# Patient Record
Sex: Female | Born: 1990 | Race: Black or African American | Hispanic: No | Marital: Single | Smoking: Current every day smoker
Health system: Southern US, Community
[De-identification: ages and names within clinical notes are randomized; demographics above are authoritative.]

## PROBLEM LIST (undated history)

## (undated) ENCOUNTER — Inpatient Hospital Stay (HOSPITAL_COMMUNITY): Payer: Self-pay

## (undated) DIAGNOSIS — R569 Unspecified convulsions: Secondary | ICD-10-CM

## (undated) DIAGNOSIS — B999 Unspecified infectious disease: Secondary | ICD-10-CM

## (undated) DIAGNOSIS — J45909 Unspecified asthma, uncomplicated: Secondary | ICD-10-CM

## (undated) HISTORY — PX: NO PAST SURGERIES: SHX2092

## (undated) HISTORY — DX: Unspecified infectious disease: B99.9

---
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
## 2010-09-02 LAB — WET PREP, GENITAL

## 2010-09-02 LAB — GC/CHLAMYDIA PROBE AMP, GENITAL
Chlamydia, DNA Probe: NEGATIVE
GC Probe Amp, Genital: POSITIVE — AB

## 2011-07-29 ENCOUNTER — Emergency Department (HOSPITAL_COMMUNITY)
Admission: EM | Admit: 2011-07-29 | Discharge: 2011-07-29 | Disposition: A | Discharge status: Home / Self Care | Payer: No Typology Code available for payment source | Attending: Emergency Medicine | Admitting: Emergency Medicine

## 2011-07-29 ENCOUNTER — Encounter (HOSPITAL_COMMUNITY): Payer: Self-pay | Admitting: *Deleted

## 2011-07-29 ENCOUNTER — Emergency Department (HOSPITAL_COMMUNITY): Payer: No Typology Code available for payment source

## 2011-07-29 DIAGNOSIS — R51 Headache: Secondary | ICD-10-CM | POA: Insufficient documentation

## 2011-07-29 DIAGNOSIS — M542 Cervicalgia: Secondary | ICD-10-CM | POA: Insufficient documentation

## 2011-07-29 DIAGNOSIS — S139XXA Sprain of joints and ligaments of unspecified parts of neck, initial encounter: Secondary | ICD-10-CM | POA: Insufficient documentation

## 2011-07-29 DIAGNOSIS — S161XXA Strain of muscle, fascia and tendon at neck level, initial encounter: Secondary | ICD-10-CM

## 2011-07-29 MED ORDER — METHOCARBAMOL 500 MG PO TABS: 500.0000 mg | ORAL_TABLET | Freq: Four times a day (QID) | ORAL | Status: AC | PRN
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
## 2011-07-29 NOTE — ED Provider Notes (Signed)
Medical screening examination/treatment/procedure(s) were performed by non-physician practitioner and as supervising physician I was immediately available for consultation/collaboration.  Ethelda Chick, MD 07/29/11 [phone]
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
## 2011-07-29 NOTE — ED Notes (Signed)
Pt ambulated with standby assist to bathroom. C-collar remains in place. Cleared by Judeth Cornfield, EDPA to walk.

## 2012-02-09 ENCOUNTER — Ambulatory Visit (INDEPENDENT_AMBULATORY_CARE_PROVIDER_SITE_OTHER): Payer: Medicaid Other | Admitting: Obstetrics and Gynecology

## 2012-02-09 DIAGNOSIS — Z331 Pregnant state, incidental: Secondary | ICD-10-CM

## 2012-02-09 LAB — POCT URINALYSIS DIPSTICK
Glucose, UA: NEGATIVE
Ketones, UA: NEGATIVE
Nitrite, UA: NEGATIVE

## 2012-02-09 NOTE — Progress Notes (Signed)
NOB INTERVIEW. PT IS UNSURE OF EXACT LMP. HAS NOB W/U 02/11/12 BUT NO U/S APPT FOR ANATOMY AVAILABLE. IS UNDECIDED ABOUT GENETIC TESTING.

## 2012-02-10 LAB — PRENATAL PANEL VII
Antibody Screen: NEGATIVE
HCT: 36.5 % (ref 36.0–46.0)
HIV: NONREACTIVE
Hemoglobin: 12.3 g/dL (ref 12.0–15.0)
Hepatitis B Surface Ag: NEGATIVE
Lymphocytes Relative: 28 % (ref 12–46)
MCV: 89.2 fL (ref 78.0–100.0)
Neutrophils Relative %: 58 % (ref 43–77)
RDW: 15.5 % (ref 11.5–15.5)
Rh Type: POSITIVE
WBC: 9.9 10*3/uL (ref 4.0–10.5)

## 2012-02-11 ENCOUNTER — Encounter: Payer: Self-pay | Admitting: Obstetrics and Gynecology

## 2012-02-11 ENCOUNTER — Ambulatory Visit (INDEPENDENT_AMBULATORY_CARE_PROVIDER_SITE_OTHER): Payer: Medicaid Other | Admitting: Obstetrics and Gynecology

## 2012-02-11 VITALS — BP 108/62 | Wt 192.0 lb

## 2012-02-11 DIAGNOSIS — Z202 Contact with and (suspected) exposure to infections with a predominantly sexual mode of transmission: Secondary | ICD-10-CM | POA: Insufficient documentation

## 2012-02-11 DIAGNOSIS — Z331 Pregnant state, incidental: Secondary | ICD-10-CM

## 2012-02-11 DIAGNOSIS — Z2089 Contact with and (suspected) exposure to other communicable diseases: Secondary | ICD-10-CM

## 2012-02-11 DIAGNOSIS — Z1379 Encounter for other screening for genetic and chromosomal anomalies: Secondary | ICD-10-CM

## 2012-02-11 DIAGNOSIS — Z3689 Encounter for other specified antenatal screening: Secondary | ICD-10-CM

## 2012-02-11 DIAGNOSIS — O0930 Supervision of pregnancy with insufficient antenatal care, unspecified trimester: Secondary | ICD-10-CM | POA: Insufficient documentation

## 2012-02-11 LAB — HEMOGLOBINOPATHY EVALUATION
Hemoglobin Other: 0 %
Hgb S Quant: 0 %

## 2012-02-11 NOTE — Progress Notes (Signed)
Subjective:    Jeanne [name] is being seen today for her first obstetrical visit at [redacted]w[redacted]d gestation by uncertain LMP.  She stopped OCPs approx 2 months before conception.  She reports no issues.  Her obstetrical history is significant for: Patient Active Problem List  Diagnosis  . Late prenatal care    Relationship with FOB:  Declined to name  Patient is not sure of feeding plan.   Pregnancy history fully reviewed.  The following portions of the patient's history were reviewed and updated as appropriate: allergies, current medications, past family history, past medical history, past social history, past surgical history and problem list.  Review of Systems Pertinent ROS is described in HPI   Objective:   BP 108/62  Wt 192 lb (87.091 kg)  LMP 09/14/2011 Wt Readings from Last 1 Encounters:  02/11/12 192 lb (87.091 kg)   BMI: There is no height on file to calculate BMI.  General: alert, cooperative and no distress Respiratory: clear to auscultation bilaterally Cardiovascular: regular rate and rhythm, S1, S2 normal, no murmur Breasts:  No dominant masses, nipples erect Gastrointestinal: soft, non-tender; no masses,  no organomegaly Extremities: extremities normal, no pain or edema Vaginal Bleeding: None  EXTERNAL GENITALIA: normal appearing vulva with no masses, tenderness or lesions VAGINA: no abnormal discharge or lesions CERVIX: no lesions or cervical motion tenderness; cervix closed, long, firm UTERUS: gravid and consistent with 20-21 weeks ADNEXA: no masses palpable and nontender OB EXAM PELVIMETRY: appears adequate   FHR:  150  bpm  Assessment:    Pregnancy at  21 3/7 weeks by unsure LMP, S=D Late to care Plan:     Prenatal panel reviewed and discussed with the patient:yes Pap smear collected:no GC/Chlamydia collected:yes Wet prep:  Negative Discussion of Genetic testing options: Wants Quad screen--done today Prenatal vitamins recommended Problem list  reviewed and updated.  Plan of care: Schedule [place] for anatomy ASAP--patient prefers exam at [facility].  Nigel Bridgeman CNM, [place] 02/11/2012 3:52 PM                                                             sickle cell negative ANAtomy US at [facility] screen today

## 2012-02-16 LAB — AFP, QUAD SCREEN
Down Syndrome Scr Risk Est: 1:12100 {titer}
HCG, Total: 19096 m[IU]/mL
INH: 199.8 pg/mL
MoM for AFP: 1.47
MoM for INH: 1.08
Open Spina bifida: NEGATIVE
uE3 Value: 1.5 ng/mL

## 2012-02-17 ENCOUNTER — Other Ambulatory Visit: Payer: Medicaid Other

## 2012-02-18 ENCOUNTER — Ambulatory Visit (INDEPENDENT_AMBULATORY_CARE_PROVIDER_SITE_OTHER): Payer: Medicaid Other | Admitting: Obstetrics and Gynecology

## 2012-02-18 ENCOUNTER — Encounter: Payer: Self-pay | Admitting: Obstetrics and Gynecology

## 2012-02-18 ENCOUNTER — Ambulatory Visit (INDEPENDENT_AMBULATORY_CARE_PROVIDER_SITE_OTHER): Payer: Medicaid Other

## 2012-02-18 VITALS — BP 98/66 | Ht 67.0 in | Wt 194.0 lb

## 2012-02-18 DIAGNOSIS — J00 Acute nasopharyngitis [common cold]: Secondary | ICD-10-CM

## 2012-02-18 DIAGNOSIS — Z331 Pregnant state, incidental: Secondary | ICD-10-CM

## 2012-02-18 DIAGNOSIS — Z3689 Encounter for other specified antenatal screening: Secondary | ICD-10-CM

## 2012-02-18 DIAGNOSIS — Z3490 Encounter for supervision of normal pregnancy, unspecified, unspecified trimester: Secondary | ICD-10-CM

## 2012-02-18 LAB — US OB COMP + 14 WK

## 2012-02-18 NOTE — Progress Notes (Signed)
Anatomy u/s today  C/o cold sx's

## 2012-02-18 NOTE — Progress Notes (Signed)
[redacted]w[redacted]d Vx presentation, anterior placenta, (placenta edge is 5.7cms from internal os. Fluid is normal ( Vertical Pocket = 4.2 cm) No anomalies seen Female gender. Normal ovaries No fluid in CDS, normal adnexas ROB x 4 weeks No have f/u USS in 4 weeks for placental location.

## 2012-03-17 ENCOUNTER — Ambulatory Visit (INDEPENDENT_AMBULATORY_CARE_PROVIDER_SITE_OTHER): Payer: Medicaid Other | Admitting: Obstetrics and Gynecology

## 2012-03-17 VITALS — BP 102/62 | Wt 202.0 lb

## 2012-03-17 DIAGNOSIS — Z3490 Encounter for supervision of normal pregnancy, unspecified, unspecified trimester: Secondary | ICD-10-CM

## 2012-03-17 DIAGNOSIS — Z331 Pregnant state, incidental: Secondary | ICD-10-CM
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
## 2012-04-04 ENCOUNTER — Telehealth: Payer: Self-pay

## 2012-04-04 MED ORDER — PRENATAL 27-0.8 MG PO TABS: 1.0000 | ORAL_TABLET | Freq: Every day | ORAL | Status: DC

## 2012-04-04 NOTE — Telephone Encounter (Signed)
Pt calling for refill of PNV.  rx sent to pharmacy for 30 with 11 refills.  Pt notified.  ld

## 2012-04-04 NOTE — Telephone Encounter (Signed)
rx called in due to failure in escribe.  ld

## 2012-04-07 ENCOUNTER — Encounter: Payer: Medicaid Other | Admitting: Obstetrics and Gynecology

## 2012-04-13 ENCOUNTER — Ambulatory Visit (INDEPENDENT_AMBULATORY_CARE_PROVIDER_SITE_OTHER): Payer: Medicaid Other | Admitting: Obstetrics and Gynecology

## 2012-04-13 VITALS — BP 110/60 | Wt 211.0 lb

## 2012-04-13 DIAGNOSIS — Z3400 Encounter for supervision of normal first pregnancy, unspecified trimester: Secondary | ICD-10-CM

## 2012-04-13 LAB — CBC
HCT: 34.5 % — ABNORMAL LOW (ref 36.0–46.0)
MCH: 31.4 pg (ref 26.0–34.0)
MCHC: 35.1 g/dL (ref 30.0–36.0)
RDW: 13.8 % (ref 11.5–15.5)

## 2012-04-13 LAB — RPR

## 2012-04-13 NOTE — Progress Notes (Signed)
[redacted]w[redacted]d Office Visit on 03/17/2012  Component Date Value Range Status  . Glucose, 1 Hour GTT 03/17/2012 102  70 - 140 mg/dL Final  CBC and RPR today Wants to change PNV to prenatal gen FKCs RTO 2wks

## 2012-04-27 ENCOUNTER — Ambulatory Visit (INDEPENDENT_AMBULATORY_CARE_PROVIDER_SITE_OTHER): Payer: Medicaid Other | Admitting: Obstetrics and Gynecology

## 2012-04-27 VITALS — BP 104/58 | Wt 209.0 lb

## 2012-04-27 DIAGNOSIS — Z331 Pregnant state, incidental: Secondary | ICD-10-CM

## 2012-04-27 MED ORDER — PRENATAL VITAMINS PLUS 27-1 MG PO TABS: 1.0000 | ORAL_TABLET | Freq: Every day | ORAL | Status: DC

## 2012-04-27 NOTE — Progress Notes (Signed)
[redacted]w[redacted]d GFM Right round ligament

## 2012-04-27 NOTE — Progress Notes (Signed)
Pt stated her skin has been very dry and itching . Pt declined the flu shot today. Pt stated no other issues today.

## 2012-05-11 ENCOUNTER — Encounter: Payer: Self-pay | Admitting: Obstetrics and Gynecology

## 2012-05-11 ENCOUNTER — Ambulatory Visit (INDEPENDENT_AMBULATORY_CARE_PROVIDER_SITE_OTHER): Payer: Medicaid Other

## 2012-05-11 ENCOUNTER — Ambulatory Visit (INDEPENDENT_AMBULATORY_CARE_PROVIDER_SITE_OTHER): Payer: Medicaid Other | Admitting: Obstetrics and Gynecology

## 2012-05-11 VITALS — BP 120/68 | Wt 212.5 lb

## 2012-05-11 DIAGNOSIS — Z331 Pregnant state, incidental: Secondary | ICD-10-CM

## 2012-05-11 DIAGNOSIS — O0930 Supervision of pregnancy with insufficient antenatal care, unspecified trimester: Secondary | ICD-10-CM

## 2012-05-11 DIAGNOSIS — O9989 Other specified diseases and conditions complicating pregnancy, childbirth and the puerperium: Secondary | ICD-10-CM

## 2012-05-11 MED ORDER — VITAFOL ULTRA 29-0.6-0.4-200 MG PO CAPS: 1.0000 | ORAL_CAPSULE | Freq: Every day | ORAL | Status: DC

## 2012-05-11 NOTE — Progress Notes (Signed)
[redacted]w[redacted]d Pt voice no complaints  Ultrasound shows:  EFW 5lbs5oz 61%ILE        [place] EDD: 06/20/2012             AFI: 15.9cm            Cervical length: 3.47 cm            Placenta localization: anterior            Fetal presentation: cephalic    Anatomy survey completed yes    Anatomy survey is normal            Gender : female    Comments: No previa. Placenta edge to cx normal. Normal fluid. AFI of 15.9cm 55th%tile. Normal linear growth. Concordant with established GA/EDD. Cx closed. Normal adnexa. Pt declines Flu vaccine Requests different prenatal.

## 2012-05-11 NOTE — Addendum Note (Signed)
Addended by: Lerry Liner D on: 05/11/2012 09:48 AM   Modules accepted: Orders

## 2012-05-16 LAB — US OB FOLLOW UP

## 2012-05-25 ENCOUNTER — Ambulatory Visit: Payer: Medicaid Other | Admitting: Obstetrics and Gynecology

## 2012-05-25 ENCOUNTER — Other Ambulatory Visit: Payer: Medicaid Other

## 2012-05-25 ENCOUNTER — Encounter: Payer: Self-pay | Admitting: Obstetrics and Gynecology

## 2012-05-26 NOTE — Progress Notes (Signed)
Appointment canceled.

## 2012-06-01 ENCOUNTER — Encounter: Payer: Self-pay | Admitting: Obstetrics and Gynecology

## 2012-06-01 ENCOUNTER — Ambulatory Visit (INDEPENDENT_AMBULATORY_CARE_PROVIDER_SITE_OTHER): Payer: Medicaid Other | Admitting: Obstetrics and Gynecology

## 2012-06-01 ENCOUNTER — Other Ambulatory Visit: Payer: Medicaid Other

## 2012-06-01 VITALS — BP 120/70 | Wt 221.0 lb

## 2012-06-01 DIAGNOSIS — Z331 Pregnant state, incidental: Secondary | ICD-10-CM

## 2012-06-01 DIAGNOSIS — B009 Herpesviral infection, unspecified: Secondary | ICD-10-CM | POA: Insufficient documentation

## 2012-06-01 MED ORDER — GNP PRENATAL VITAMINS 28-0.8 MG PO TABS: 0.8000 mg | ORAL_TABLET | Freq: Every day | ORAL | Status: DC

## 2012-06-01 MED ORDER — VALACYCLOVIR HCL 500 MG PO TABS: 500.0000 mg | ORAL_TABLET | Freq: Every day | ORAL | Status: DC

## 2012-06-01 NOTE — Progress Notes (Signed)
[redacted]w[redacted]d The patient says she has a history of herpes.  She denies outbreaks.  We'll begin Valtrex 500 mg daily. Beta strep, GC, Chlamydia sent. No lesions on exam. possible carpal tunnel syndrome area Return office in 1 week. Dr. Stefano Gaul

## 2012-06-01 NOTE — Addendum Note (Signed)
Addended by: Janine Limbo on: 06/01/2012 07:22 PM   Modules accepted: Orders

## 2012-06-01 NOTE — Addendum Note (Signed)
Addended byWinfred Leeds on: 06/01/2012 12:08 PM   Modules accepted: Orders

## 2012-06-01 NOTE — Progress Notes (Signed)
[redacted]w[redacted]d PT STATES THINGS ARE HARD TO HOLD WITH HER HANDS. PT STATES THE PNV MAKE HER HANDS RED. PT WANT ANOTHER PRENATAL VIT.

## 2012-06-02 LAB — GC/CHLAMYDIA PROBE AMP: GC Probe RNA: NEGATIVE

## 2012-06-07 ENCOUNTER — Encounter: Payer: Self-pay | Admitting: Obstetrics and Gynecology

## 2012-06-07 ENCOUNTER — Telehealth: Payer: Self-pay | Admitting: Obstetrics and Gynecology

## 2012-06-07 ENCOUNTER — Ambulatory Visit (INDEPENDENT_AMBULATORY_CARE_PROVIDER_SITE_OTHER): Payer: Medicaid Other | Admitting: Obstetrics and Gynecology

## 2012-06-07 VITALS — BP 108/64 | Wt 222.0 lb

## 2012-06-07 DIAGNOSIS — Z331 Pregnant state, incidental: Secondary | ICD-10-CM

## 2012-06-07 NOTE — Telephone Encounter (Signed)
TC from pt. Questioning what effacement means. Explained effacement and dilation of cervix. Pt verbalizes comprehension.

## 2012-06-07 NOTE — Progress Notes (Signed)
[redacted]w[redacted]d Pt has no complaints Pt wants cervix checked

## 2012-06-07 NOTE — Progress Notes (Signed)
[redacted]w[redacted]d GFM No contractions  No change in discharge Valtrex while breastfeeding discussed.

## 2012-06-14 ENCOUNTER — Encounter (HOSPITAL_COMMUNITY): Payer: Self-pay | Admitting: *Deleted

## 2012-06-14 ENCOUNTER — Encounter (HOSPITAL_COMMUNITY): Payer: Self-pay | Admitting: Anesthesiology

## 2012-06-14 ENCOUNTER — Inpatient Hospital Stay (HOSPITAL_COMMUNITY)
Admission: AD | Admit: 2012-06-14 | Discharge: 2012-06-14 | Disposition: A | Discharge status: Home / Self Care | Payer: Medicaid Other | Source: Ambulatory Visit | Attending: Obstetrics and Gynecology | Admitting: Obstetrics and Gynecology

## 2012-06-14 ENCOUNTER — Inpatient Hospital Stay (HOSPITAL_COMMUNITY): Payer: Medicaid Other | Admitting: Anesthesiology

## 2012-06-14 ENCOUNTER — Inpatient Hospital Stay (HOSPITAL_COMMUNITY)
Admission: AD | Admit: 2012-06-14 | Discharge: 2012-06-17 | DRG: 775 | Disposition: A | Discharge status: Home / Self Care | Payer: Medicaid Other | Source: Ambulatory Visit | Attending: Obstetrics and Gynecology | Admitting: Obstetrics and Gynecology

## 2012-06-14 DIAGNOSIS — O0930 Supervision of pregnancy with insufficient antenatal care, unspecified trimester: Secondary | ICD-10-CM

## 2012-06-14 DIAGNOSIS — O99891 Other specified diseases and conditions complicating pregnancy: Secondary | ICD-10-CM | POA: Insufficient documentation

## 2012-06-14 DIAGNOSIS — J00 Acute nasopharyngitis [common cold]: Secondary | ICD-10-CM

## 2012-06-14 DIAGNOSIS — B009 Herpesviral infection, unspecified: Secondary | ICD-10-CM

## 2012-06-14 DIAGNOSIS — R109 Unspecified abdominal pain: Secondary | ICD-10-CM | POA: Insufficient documentation

## 2012-06-14 DIAGNOSIS — Z22330 Carrier of Group B streptococcus: Secondary | ICD-10-CM

## 2012-06-14 DIAGNOSIS — O99892 Other specified diseases and conditions complicating childbirth: Secondary | ICD-10-CM | POA: Diagnosis present

## 2012-06-14 DIAGNOSIS — Z202 Contact with and (suspected) exposure to infections with a predominantly sexual mode of transmission: Secondary | ICD-10-CM

## 2012-06-14 DIAGNOSIS — O479 False labor, unspecified: Secondary | ICD-10-CM

## 2012-06-14 DIAGNOSIS — O4290 Premature rupture of membranes, unspecified as to length of time between rupture and onset of labor, unspecified weeks of gestation: Secondary | ICD-10-CM

## 2012-06-14 LAB — CBC
HCT: 40.6 % (ref 36.0–46.0)
MCV: 92.3 fL (ref 78.0–100.0)
Platelets: 176 10*3/uL (ref 150–400)
RBC: 4.4 MIL/uL (ref 3.87–5.11)
WBC: 17.5 10*3/uL — ABNORMAL HIGH (ref 4.0–10.5)

## 2012-06-14 MED ORDER — PENICILLIN G POTASSIUM 5000000 UNITS IJ SOLR
2.5000 10*6.[IU] | INTRAVENOUS | Status: DC
  Administered 2012-06-14 – 2012-06-15 (×2): 2.5 10*6.[IU] via INTRAVENOUS
  Filled 2012-06-14 (×6): qty 2.5

## 2012-06-14 MED ORDER — OXYTOCIN 40 UNITS IN LACTATED RINGERS INFUSION - SIMPLE MED: 62.5000 mL/h | INTRAVENOUS | Status: DC

## 2012-06-14 MED ORDER — HYDROXYZINE HCL 50 MG/ML IM SOLN: 50.0000 mg | Freq: Four times a day (QID) | INTRAMUSCULAR | Status: DC | PRN

## 2012-06-14 MED ORDER — ONDANSETRON HCL 4 MG/2ML IJ SOLN: 4.0000 mg | Freq: Four times a day (QID) | INTRAMUSCULAR | Status: DC | PRN

## 2012-06-14 MED ORDER — OXYCODONE-ACETAMINOPHEN 5-325 MG PO TABS
1.0000 | ORAL_TABLET | ORAL | Status: DC | PRN
Start: 2012-06-14 — End: 2012-06-15

## 2012-06-14 MED ORDER — FENTANYL 2.5 MCG/ML BUPIVACAINE 1/10 % EPIDURAL INFUSION (WH - ANES)
INTRAMUSCULAR | Status: DC | PRN
  Administered 2012-06-14: 14 mL/h via EPIDURAL

## 2012-06-14 MED ORDER — LIDOCAINE HCL (PF) 1 % IJ SOLN
INTRAMUSCULAR | Status: DC | PRN
  Administered 2012-06-14 (×2): 4 mL

## 2012-06-14 MED ORDER — BUTORPHANOL TARTRATE 1 MG/ML IJ SOLN: 1.0000 mg | INTRAMUSCULAR | Status: DC | PRN

## 2012-06-14 MED ORDER — PENICILLIN G POTASSIUM 5000000 UNITS IJ SOLR
5.0000 10*6.[IU] | Freq: Once | INTRAVENOUS | Status: AC
  Administered 2012-06-14: 5 10*6.[IU] via INTRAVENOUS
  Filled 2012-06-14: qty 5

## 2012-06-14 MED ORDER — DIPHENHYDRAMINE HCL 50 MG/ML IJ SOLN: 12.5000 mg | INTRAMUSCULAR | Status: DC | PRN

## 2012-06-14 MED ORDER — TERBUTALINE SULFATE 1 MG/ML IJ SOLN: 0.2500 mg | Freq: Once | INTRAMUSCULAR | Status: AC | PRN

## 2012-06-14 MED ORDER — PHENYLEPHRINE 40 MCG/ML (10ML) SYRINGE FOR IV PUSH (FOR BLOOD PRESSURE SUPPORT): 80.0000 ug | PREFILLED_SYRINGE | INTRAVENOUS | Status: DC | PRN

## 2012-06-14 MED ORDER — FENTANYL 2.5 MCG/ML BUPIVACAINE 1/10 % EPIDURAL INFUSION (WH - ANES)
14.0000 mL/h | INTRAMUSCULAR | Status: DC
  Administered 2012-06-15: 14 mL/h via EPIDURAL
  Filled 2012-06-14 (×2): qty 125

## 2012-06-14 MED ORDER — EPHEDRINE 5 MG/ML INJ: 10.0000 mg | INTRAVENOUS | Status: DC | PRN

## 2012-06-14 MED ORDER — PHENYLEPHRINE 40 MCG/ML (10ML) SYRINGE FOR IV PUSH (FOR BLOOD PRESSURE SUPPORT)
80.0000 ug | PREFILLED_SYRINGE | INTRAVENOUS | Status: DC | PRN
  Filled 2012-06-14: qty 5

## 2012-06-14 MED ORDER — OXYTOCIN 40 UNITS IN LACTATED RINGERS INFUSION - SIMPLE MED
1.0000 m[IU]/min | INTRAVENOUS | Status: DC
  Administered 2012-06-14: 2 m[IU]/min via INTRAVENOUS
  Filled 2012-06-14: qty 1000

## 2012-06-14 MED ORDER — LIDOCAINE HCL (PF) 1 % IJ SOLN
30.0000 mL | INTRAMUSCULAR | Status: DC | PRN
  Filled 2012-06-14: qty 30

## 2012-06-14 MED ORDER — ACETAMINOPHEN 325 MG PO TABS: 650.0000 mg | ORAL_TABLET | ORAL | Status: DC | PRN

## 2012-06-14 MED ORDER — MORPHINE SULFATE 10 MG/ML IJ SOLN: 10.0000 mg | Freq: Once | INTRAMUSCULAR | Status: DC | PRN

## 2012-06-14 MED ORDER — LACTATED RINGERS IV SOLN: 500.0000 mL | INTRAVENOUS | Status: DC | PRN

## 2012-06-14 MED ORDER — ZOLPIDEM TARTRATE 5 MG PO TABS
5.0000 mg | ORAL_TABLET | Freq: Once | ORAL | Status: AC | PRN
  Administered 2012-06-14: 5 mg via ORAL
  Filled 2012-06-14: qty 1

## 2012-06-14 MED ORDER — EPHEDRINE 5 MG/ML INJ
10.0000 mg | INTRAVENOUS | Status: DC | PRN
  Filled 2012-06-14: qty 4

## 2012-06-14 MED ORDER — CITRIC ACID-SODIUM CITRATE 334-500 MG/5ML PO SOLN: 30.0000 mL | ORAL | Status: DC | PRN

## 2012-06-14 MED ORDER — IBUPROFEN 600 MG PO TABS: 600.0000 mg | ORAL_TABLET | Freq: Four times a day (QID) | ORAL | Status: DC | PRN

## 2012-06-14 MED ORDER — LACTATED RINGERS IV SOLN
INTRAVENOUS | Status: DC
  Administered 2012-06-14 (×2): via INTRAVENOUS

## 2012-06-14 MED ORDER — LACTATED RINGERS IV SOLN
500.0000 mL | Freq: Once | INTRAVENOUS | Status: AC
  Administered 2012-06-14: 1000 mL via INTRAVENOUS

## 2012-06-14 MED ORDER — OXYTOCIN BOLUS FROM INFUSION: 500.0000 mL | INTRAVENOUS | Status: DC

## 2012-06-14 NOTE — Progress Notes (Signed)
Comfortable with epidural states sleepy agrees to IV pitocin O BP 118/75  Pulse 88  Temp 98.3 F (36.8 C) (Oral)  Resp 18  Ht 5\' 6"  (1.676 m)  Wt 221 lb 4.8 oz (100.381 kg)  BMI 35.72 kg/m2  SpO2 100%  LMP 09/14/2011      fhts category 1      abd soft between uc      Contractions q 2-6 mild      Vag not assessed VTX per [place] Pitocin protocol A [redacted]w[redacted]d srom P pitocin augmentation discussed limited vag exams pt agrees, encouraged to rest, family at bedside, continue care Lavera Guise, CNM

## 2012-06-14 NOTE — MAU Note (Signed)
Patient state she is having contractions every 2-3 minutes with bloody show. Reports good fetal movement.
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
## 2012-06-14 NOTE — MAU Note (Signed)
H. Steelman CNM notified of pt. 

## 2012-06-14 NOTE — Anesthesia Procedure Notes (Signed)
Epidural Patient location during procedure: OB Start time: 06/14/2012 5:23 PM  Staffing Anesthesiologist: Gualberto Wahlen A. Performed by: anesthesiologist   Preanesthetic Checklist Completed: patient identified, site marked, surgical consent, pre-op evaluation, timeout performed, IV checked, risks and benefits discussed and monitors and equipment checked  Epidural Patient position: sitting Prep: site prepped and draped and DuraPrep Patient monitoring: continuous pulse ox and blood pressure Approach: midline Injection technique: LOR air  Needle:  Needle type: Tuohy  Needle gauge: 17 G Needle length: 9 cm and 9 Needle insertion depth: 5 cm cm Catheter type: closed end flexible Catheter size: 19 Gauge Catheter at skin depth: 10 cm Test dose: negative and Other  Assessment Events: blood not aspirated, injection not painful, no injection resistance, negative IV test and no paresthesia  Additional Notes Patient identified. Risks and benefits discussed including failed block, incomplete  Pain control, post dural puncture headache, nerve damage, paralysis, blood pressure Changes, nausea, vomiting, reactions to medications-both toxic and allergic and post Partum back pain. All questions were answered. Patient expressed understanding and wished to proceed. Sterile technique was used throughout procedure. Epidural site was Dressed with sterile barrier dressing. No paresthesias, signs of intravascular injection Or signs of intrathecal spread were encountered.  Patient was more comfortable after the epidural was dosed. Please see RN's note for documentation of vital signs and FHR which are stable.

## 2012-06-14 NOTE — Anesthesia Preprocedure Evaluation (Signed)
Anesthesia Evaluation  Patient identified by MRN, date of birth, ID band Patient awake    Reviewed: Allergy & Precautions, H&P , Patient's Chart, lab work & pertinent test results  Airway Mallampati: III TM Distance: >3 FB Neck ROM: full    Dental No notable dental hx. (+) Teeth Intact   Pulmonary former smoker,  breath sounds clear to auscultation  Pulmonary exam normal       Cardiovascular negative cardio ROS  Rhythm:regular Rate:Normal     Neuro/Psych negative neurological ROS  negative psych ROS   GI/Hepatic negative GI ROS, Neg liver ROS,   Endo/Other  Morbid obesity  Renal/GU negative Renal ROS  negative genitourinary   Musculoskeletal   Abdominal   Peds  Hematology negative hematology ROS (+)   Anesthesia Other Findings   Reproductive/Obstetrics (+) Pregnancy HSV                           Anesthesia Physical Anesthesia Plan  ASA: II  Anesthesia Plan: Epidural   Post-op Pain Management:    Induction:   Airway Management Planned:   Additional Equipment:   Intra-op Plan:   Post-operative Plan:   Informed Consent: I have reviewed the patients History and Physical, chart, labs and discussed the procedure including the risks, benefits and alternatives for the proposed anesthesia with the patient or authorized representative who has indicated his/her understanding and acceptance.     Plan Discussed with: Anesthesiologist  Anesthesia Plan Comments:         Anesthesia Quick Evaluation

## 2012-06-14 NOTE — MAU Note (Signed)
Pt G1 at 39.1wks having cramping all day that became worse at 2200 and having  Bloody Show.

## 2012-06-14 NOTE — H&P (Signed)
Federica T Bocock is a 21 y.o. female presenting for "uc all night denies srom and not sure if baby is active, has seen some blood, not aware of SROM but maybe at 1100 am, denies taking medication just PNV, plans epidural" Uncertain of LMP dating by 21 week [place] History OB History    Grav Para Term Preterm Abortions TAB SAB Ect Mult Living   1 0 0 0 0 0 0 0 0 0      Past Medical History  Diagnosis Date  . Infection     UTI X1  . 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 Infection     CHLAMYDIA  ?  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 Infection     [facility]   Past Surgical History  Procedure Date  . No past surgeries    Family History: family history includes Diabetes in her maternal grandmother; Pulmonary embolism in her maternal grandfather; and Stroke in her maternal grandmother. Social History:  reports that she quit smoking about 6 months ago. Her smoking use included Cigarettes. She smoked .3 packs per day. She has never used smokeless tobacco. She reports that she drinks alcohol. She reports that she does not use illicit drugs.   Prenatal Transfer Tool  Maternal Diabetes: No Genetic Screening: Normal Maternal Ultrasounds/Referrals: Normal anterior placenta Fetal Ultrasounds or other Referrals:  None Maternal Substance Abuse:  Not tested Significant Maternal Medications:  None Significant Maternal Lab Results:  Lab values include: Group B Strep positive hx of HSV no outbreaks with pregnancy Other Comments:  None  ROS Calm, no distress, cooperative, deep breathing well with uc HEENT grossly  WNL grossly,  lungs clear bilaterally, AP RRR,  abd soft nt,no masses, not tympanic bowel sounds active,  No edema to lower extremities Normal hair distrubition mons pubis,  EGBUS WNL,  sterile speculum exam vagina pink, moist normal rugae, bloody show No hx lesions seen, thin meconium Fhts category 1 UC q 2-4 mild  Dilation: 3 Effacement (%): 100 Station: -2 Exam by:: M. Adya Wirz, CNM Blood  pressure 136/85, pulse 92, temperature 99.2 F (37.3 C), temperature source Oral, resp. rate 20, last menstrual period 09/14/2011, SpO2 100.00%. Exam Physical Exam  Prenatal labs: ABO, Rh: O/POS/-- (08/27 1452) Antibody: NEG (08/27 1452) Rubella: 71.3 (08/27 1452) RPR: NON REAC (10/30 1105)  HBsAg: NEGATIVE (08/27 1452)  HIV: NON REACTIVE (08/27 1452)  GBS: POSITIVE (12/18 1216)   Assessment/Plan: [redacted]w[redacted]d SROM thin meconium GBS +  Hx HSV Admission, plans epidural, Pen G, collaboration with Dr. Su Hilt per telephone.  Shaneka Efaw 06/14/2012, 3:11 PM

## 2012-06-15 ENCOUNTER — Encounter (HOSPITAL_COMMUNITY): Payer: Self-pay | Admitting: *Deleted

## 2012-06-15 MED ORDER — IBUPROFEN 600 MG PO TABS
600.0000 mg | ORAL_TABLET | Freq: Four times a day (QID) | ORAL | Status: DC
  Administered 2012-06-15 – 2012-06-17 (×9): 600 mg via ORAL
  Filled 2012-06-15 (×10): qty 1

## 2012-06-15 MED ORDER — SENNOSIDES-DOCUSATE SODIUM 8.6-50 MG PO TABS
2.0000 | ORAL_TABLET | Freq: Every day | ORAL | Status: DC
  Administered 2012-06-15 – 2012-06-16 (×2): 2 via ORAL

## 2012-06-15 MED ORDER — DIPHENHYDRAMINE HCL 25 MG PO CAPS: 25.0000 mg | ORAL_CAPSULE | Freq: Four times a day (QID) | ORAL | Status: DC | PRN

## 2012-06-15 MED ORDER — ONDANSETRON HCL 4 MG/2ML IJ SOLN: 4.0000 mg | INTRAMUSCULAR | Status: DC | PRN

## 2012-06-15 MED ORDER — OXYCODONE-ACETAMINOPHEN 5-325 MG PO TABS
1.0000 | ORAL_TABLET | ORAL | Status: DC | PRN
  Administered 2012-06-15: 1 via ORAL
  Filled 2012-06-15: qty 1

## 2012-06-15 MED ORDER — MAGNESIUM HYDROXIDE 400 MG/5ML PO SUSP: 30.0000 mL | ORAL | Status: DC | PRN

## 2012-06-15 MED ORDER — MEASLES, MUMPS & RUBELLA VAC ~~LOC~~ INJ
0.5000 mL | INJECTION | Freq: Once | SUBCUTANEOUS | Status: DC
  Filled 2012-06-15: qty 0.5

## 2012-06-15 MED ORDER — METHYLERGONOVINE MALEATE 0.2 MG/ML IJ SOLN: 0.2000 mg | INTRAMUSCULAR | Status: DC | PRN

## 2012-06-15 MED ORDER — DIBUCAINE 1 % RE OINT: 1.0000 "application " | TOPICAL_OINTMENT | RECTAL | Status: DC | PRN

## 2012-06-15 MED ORDER — TETANUS-DIPHTH-ACELL PERTUSSIS 5-2.5-18.5 LF-MCG/0.5 IM SUSP: 0.5000 mL | Freq: Once | INTRAMUSCULAR | Status: DC

## 2012-06-15 MED ORDER — LANOLIN HYDROUS EX OINT: TOPICAL_OINTMENT | CUTANEOUS | Status: DC | PRN

## 2012-06-15 MED ORDER — ZOLPIDEM TARTRATE 5 MG PO TABS: 5.0000 mg | ORAL_TABLET | Freq: Every evening | ORAL | Status: DC | PRN

## 2012-06-15 MED ORDER — WITCH HAZEL-GLYCERIN EX PADS: 1.0000 "application " | MEDICATED_PAD | CUTANEOUS | Status: DC | PRN

## 2012-06-15 MED ORDER — SIMETHICONE 80 MG PO CHEW: 80.0000 mg | CHEWABLE_TABLET | ORAL | Status: DC | PRN

## 2012-06-15 MED ORDER — PRENATAL MULTIVITAMIN CH
1.0000 | ORAL_TABLET | Freq: Every day | ORAL | Status: DC
  Administered 2012-06-15 – 2012-06-17 (×3): 1 via ORAL
  Filled 2012-06-15 (×4): qty 1

## 2012-06-15 MED ORDER — METHYLERGONOVINE MALEATE 0.2 MG PO TABS: 0.2000 mg | ORAL_TABLET | ORAL | Status: DC | PRN

## 2012-06-15 MED ORDER — ONDANSETRON HCL 4 MG PO TABS: 4.0000 mg | ORAL_TABLET | ORAL | Status: DC | PRN

## 2012-06-15 MED ORDER — BENZOCAINE-MENTHOL 20-0.5 % EX AERO: 1.0000 "application " | INHALATION_SPRAY | CUTANEOUS | Status: DC | PRN

## 2012-06-15 NOTE — L&D Delivery Note (Signed)
Delivery Note Pushing started at 0309.  Deeper variables noted w/ pushing, and oxygen placed via mask.  At 3:30 AM a viable female was delivered via Vaginal, Spontaneous Delivery (Presentation: LOA ).  APGAR: 9, 9; weight TBA.   LNCX1 reduced easily over infant's head, prior to delivery of body.  Newborn placed immediately on mom's abdomen where dried and stimulated and spontaneous cry.  Cord doubly clamped and cut by s.o. Newborn has a BM and voided since delivery.   Placenta status: Intact, Spontaneous, Schultz, meconium-stained.  Cord: 3 vessels with the following complications: None.  Cord pH: not collected.   Anesthesia: Epidural  Episiotomy: None Lacerations: none Suture Repair: n/a Est. Blood Loss (mL): 59mo  Mom to postpartum.  Baby to nursery-stable. Plans to breastfeed. Pt, newborn, and family all bonding appropriately and all stable in L&D recovery.  Jeanne [name] H 06/15/2012, 4:07 AM

## 2012-06-15 NOTE — Progress Notes (Signed)
Subjective: Called to bedside w/ pt c/o rectal pressure.  Pitocin on 18mu.  Visitors remain at bedside and awake.  RN reports pt moving about in bed more since last exam.   Objective: BP 105/52  Pulse 93  Temp 98.8 F (37.1 C) (Oral)  Resp 20  Ht 5\' 6"  (1.676 m)  Wt 221 lb 4.8 oz (100.381 kg)  BMI 35.72 kg/m2  SpO2 100%  LMP 09/14/2011 I/O last 3 completed shifts: In: -  Out: 250 [Urine:250]   AGA infant 6.5-7.5lbs per Leopold's  FHT:  FHR: 130 bpm, variability: moderate,  accelerations:  Present,  decelerations:  Present mild intermittent variables; improve w/ position changes, but recur sporadically UC:   regular, every 1.5-3 minutes SVE:   Dilation: 8 Effacement (%): 90 Station: -1 Exam by:: Macario Shear Asynclitic and suspect OP MVU's 130-180 since IUPC inserted Labs: Lab Results  Component Value Date   WBC 17.5* 06/14/2012   HGB 13.9 06/14/2012   HCT 40.6 06/14/2012   MCV 92.3 06/14/2012   PLT 176 06/14/2012    Assessment / Plan: 1. [redacted]w[redacted]d 2. transition 3. MSF 4. GBS pos 5. intermittent variables 6. questionable OP  Labor: progress on Pitocin despite inconsistent MVU's Preeclampsia:  no signs or symptoms of toxicity Fetal Wellbeing:  Category II Pain Control:  Epidural I/D:  n/a Anticipated MOD:  NSVD 1. Rt turned in Rt tilt position; fetal back palpated on pt's Lt per Leopold's.   2. Pitocin decreased to 14mu from 18. 3.  Will CTO closely.  Amnioinfusion if variables worsen, prn.  C/w MD prn.  Skai Lickteig H 06/15/2012, 12:56 AM

## 2012-06-15 NOTE — Anesthesia Postprocedure Evaluation (Signed)
Anesthesia Post Note  Patient: Jeanne [name]  Procedure(s) Performed: * No procedures listed *  Anesthesia type: Epidural  Patient location: Mother/Baby  Post pain: Pain level controlled  Post assessment: Post-op Vital signs reviewed  Last Vitals:  Filed Vitals:   06/15/12 0745  BP: 117/69  Pulse: 91  Temp: 36.9 C  Resp: 20    Post vital signs: Reviewed  Level of consciousness:alert  Complications: No apparent anesthesia complications

## 2012-06-16 ENCOUNTER — Encounter: Payer: Medicaid Other | Admitting: Obstetrics and Gynecology

## 2012-06-16 NOTE — Progress Notes (Signed)
UR chart review completed.  

## 2012-06-16 NOTE — Progress Notes (Signed)
Post Partum Day 1 Subjective: no complaints, up ad lib, voiding, tolerating PO, + flatus and FOB in BR on my arrival to room. Newborn fussy on my arrival, and helped pt get her to breast.  VB lighter today.  Hasn't eaten lunch yet.  No dizziness.   Objective: Blood pressure 134/84, pulse 74, temperature 98 F (36.7 C), temperature source Oral, resp. rate 20, height 5\' 6"  (1.676 m), weight 221 lb 4.8 oz (100.381 kg), last menstrual period 09/14/2011, SpO2 98.00%, unknown if currently breastfeeding.  Physical Exam:  General: alert, cooperative, no distress and mildly obese Lochia: appropriate, rubra Uterine Fundus: firm, below umbilicus Incision: n/a DVT Evaluation: No evidence of DVT seen on physical exam. Negative Homan's sign. No significant calf/ankle edema.   Basename 06/14/12 1605  HGB 13.9  HCT 40.6    Assessment/Plan: Plan for discharge tomorrow, Breastfeeding and Lactation consult   LOS: 2 days   Mickelle Goupil H 06/16/2012, 2:30 PM

## 2012-06-17 MED ORDER — IBUPROFEN 800 MG PO TABS: 800.0000 mg | ORAL_TABLET | Freq: Three times a day (TID) | ORAL | Status: DC | PRN

## 2012-06-17 MED ORDER — OXYCODONE-ACETAMINOPHEN 5-325 MG PO TABS: 1.0000 | ORAL_TABLET | ORAL | Status: DC | PRN

## 2012-06-17 MED ORDER — FERROUS SULFATE 325 (65 FE) MG PO TABS: 325.0000 mg | ORAL_TABLET | Freq: Three times a day (TID) | ORAL | Status: DC

## 2012-06-17 MED ORDER — MEDROXYPROGESTERONE ACETATE 150 MG/ML IM SUSP: 150.0000 mg | INTRAMUSCULAR | Status: DC

## 2012-06-17 NOTE — Progress Notes (Signed)
Post Partum Day 2  Subjective: Uterine contractions  Objective: Blood pressure 136/82, pulse 66, temperature 98.3 F (36.8 C), temperature source Tympanic, resp. rate 18, height 5\' 6"  (1.676 m), weight 221 lb 4.8 oz (100.381 kg), last menstrual period 09/14/2011, SpO2 98.00%, unknown if currently breastfeeding.  Physical Exam:  General: no distress Lochia: appropriate Uterine Fundus: firm Incision: NA DVT Evaluation: No evidence of DVT seen on physical exam.   Basename 06/14/12 1605  HGB 13.9  HCT 40.6    Assessment/Plan: Discharge home, Breastfeeding and Contraception Depo-Provera   LOS: 3 days   Bird Tailor V 06/17/2012, 11:01 AM

## 2012-06-20 NOTE — Discharge Summary (Signed)
Vaginal Delivery Discharge Summary  Jeanne [name]  DOB:    30-Mar-1991 MRN:    [id number] CSN:    [id number]  Date of admission:                  06/14/2012  Date of discharge:                   06/17/2012  Procedures this admission:  06/15/2012  Normal spontaneous vaginal delivery by [place] Ana certified nurse midwife  Newborn Data:  Live born female  Birth Weight: 6 lb 0.8 oz (2745 g) APGAR: 9, 9  Home with mother. Name: ?  History of Present Illness:  Jeanne [name] is a 22 y.o. female, G1P1001, who presents at [redacted]w[redacted]d weeks gestation. The patient has been followed at the [facility] of [facility]. She was admitted onset of labor. 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  Hospital course:  The patient was admitted for labor.   Her labor was not complicated. No herpes lesions were noted. She was given antibiotics during labor. She proceeded to have a vaginal delivery of a healthy infant. Her delivery was not complicated. Her postpartum course was not complicated. She was discharged to home on postpartum day 2 doing well.  Feeding:  breast  Contraception:  Depo-Provera  Discharge hemoglobin:  Hemoglobin  Date Value Range Status  06/14/2012 13.9  12.0 - 15.0 g/dL Final     HCT  Date Value Range Status  06/14/2012 40.6  36.0 - 46.0 % Final    Discharge Physical Exam:   General: no distress Lochia: appropriate Uterine Fundus: firm Incision: NA DVT Evaluation: No evidence of DVT seen on physical exam.  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 In 6 weeks.   Contact information:   [address] [place] [address]           Janine Limbo 06/20/2012

## 2012-07-01 ENCOUNTER — Telehealth: Payer: Self-pay | Admitting: Obstetrics and Gynecology

## 2012-07-01 MED ORDER — DOCUSATE SODIUM 100 MG PO CAPS: 100.0000 mg | ORAL_CAPSULE | Freq: Two times a day (BID) | ORAL | Status: DC

## 2012-07-01 MED ORDER — HYDROCODONE-ACETAMINOPHEN 5-300 MG PO TABS: ORAL_TABLET | ORAL | Status: DC

## 2012-07-01 NOTE — Telephone Encounter (Signed)
Tc to pt per telephone call. Pt c/o abd cramping 7/10 s/p vag delivery 06/14/12. No fever. No heavy bldg. Pt states,"unable to come to office until next post partum date sched 07/20/12. Pt has tried Ibuprofen without improvement. Pt c/o constipation;but having BM's. Informed pt to increase water intake, try stool softner 1-2 times daily. If no improvement, pt may try Miralax. Consulted with VL, pt may try Vicodin. If pain persist, pt needs office eval. Pt agrees.

## 2012-07-01 NOTE — Telephone Encounter (Signed)
Pt needs refill on Oxycodone called to [facility] on [address]. Have questions about stool softner.

## 2012-07-20 ENCOUNTER — Encounter: Payer: Self-pay | Admitting: Obstetrics and Gynecology

## 2012-07-20 ENCOUNTER — Ambulatory Visit: Payer: Medicaid Other | Admitting: Obstetrics and Gynecology

## 2012-07-20 DIAGNOSIS — Z113 Encounter for screening for infections with a predominantly sexual mode of transmission: Secondary | ICD-10-CM

## 2012-07-20 NOTE — Addendum Note (Signed)
Addended by: Darien Ramus on: 07/20/2012 12:42 PM   Modules accepted: Orders

## 2012-07-20 NOTE — Progress Notes (Signed)
Jeanne [name]  is 4 weeks postpartum following a spontaneous vaginal delivery at 55 gestational weeks Date: 06/15/2012 female baby named Jeanne [name] delivered by [facility]  Breastfeeding: yes Bottlefeeding:  yes  Post-partum blues / depression:  No EPDS score: 2 History of abnormal Pap:  no  Last Pap: Date  Per pt 2012 Gestational diabetes:  no  Contraception:  Desires Nexplanon  Normal urinary function:  yes Normal GI function: no constipated sees little blood  Returning to work:  no  Subjective:     Hospital doctor T Clendenning is a 22 y.o. female who presents for a postpartum visit.  I have fully reviewed the prenatal and intrapartum course.    Patient is not sexually active.   The following portions of the patient's history were reviewed and updated as appropriate: allergies, current medications, past family history, past medical history, past social history, past surgical history and problem list.  Review of Systems Pertinent items are noted in HPI.   Objective:    Breastfeeding? Unknown  General:  alert, cooperative and no distress     Lungs: clear to auscultation bilaterally  Heart:  regular rate and rhythm, S1, S2 normal, no murmur  Abdomen: soft, non-tender; bowel sounds normal; no masses,  no organomegaly   Vulva:  normal  Vagina: normal vagina  Cervix:  normal  Corpus: normal size, contour, position, consistency, mobility, non-tender  Adnexa:  normal adnexa        Assessment:   Normal postpartum exam.  Pap smear done at today's visit.  Plan:   Pap Smear Done Today Stool Softener Daily Recommended  Sch Nexplanon Insertion  OTC Hydrocortisone 1 % cream for Eczema  Silverio Lay MD 07/20/2012 9:16 AM

## 2012-07-21 LAB — PAP IG, CT-NG, RFX HPV ASCU
Chlamydia Probe Amp: NEGATIVE
GC Probe Amp: NEGATIVE

## 2012-08-26 ENCOUNTER — Telehealth: Payer: Self-pay | Admitting: Obstetrics and Gynecology

## 2012-08-29 NOTE — Telephone Encounter (Signed)
Lm on vm tcb rgd msg 

## 2012-10-22 ENCOUNTER — Emergency Department (HOSPITAL_COMMUNITY): Payer: Self-pay

## 2012-10-22 ENCOUNTER — Encounter (HOSPITAL_COMMUNITY): Payer: Self-pay

## 2012-10-22 ENCOUNTER — Emergency Department (HOSPITAL_COMMUNITY)
Admission: EM | Admit: 2012-10-22 | Discharge: 2012-10-23 | Disposition: A | Discharge status: Home / Self Care | Payer: Self-pay | Attending: Emergency Medicine | Admitting: Emergency Medicine

## 2012-10-22 DIAGNOSIS — R000 Tachycardia, unspecified: Secondary | ICD-10-CM | POA: Insufficient documentation

## 2012-10-22 DIAGNOSIS — Z733 Stress, not elsewhere classified: Secondary | ICD-10-CM | POA: Insufficient documentation

## 2012-10-22 DIAGNOSIS — R110 Nausea: Secondary | ICD-10-CM | POA: Insufficient documentation

## 2012-10-22 DIAGNOSIS — R51 Headache: Secondary | ICD-10-CM | POA: Insufficient documentation

## 2012-10-22 DIAGNOSIS — J3489 Other specified disorders of nose and nasal sinuses: Secondary | ICD-10-CM | POA: Insufficient documentation

## 2012-10-22 DIAGNOSIS — R0602 Shortness of breath: Secondary | ICD-10-CM | POA: Insufficient documentation

## 2012-10-22 DIAGNOSIS — R0682 Tachypnea, not elsewhere classified: Secondary | ICD-10-CM | POA: Insufficient documentation

## 2012-10-22 DIAGNOSIS — Z87440 Personal history of urinary (tract) infections: Secondary | ICD-10-CM | POA: Insufficient documentation

## 2012-10-22 DIAGNOSIS — Z8619 Personal history of other infectious and parasitic diseases: Secondary | ICD-10-CM | POA: Insufficient documentation

## 2012-10-22 DIAGNOSIS — R062 Wheezing: Secondary | ICD-10-CM | POA: Insufficient documentation

## 2012-10-22 DIAGNOSIS — R071 Chest pain on breathing: Secondary | ICD-10-CM | POA: Insufficient documentation

## 2012-10-22 DIAGNOSIS — F439 Reaction to severe stress, unspecified: Secondary | ICD-10-CM

## 2012-10-22 LAB — CBC
Hemoglobin: 13.5 g/dL (ref 12.0–15.0)
RBC: 4.51 MIL/uL (ref 3.87–5.11)
WBC: 10.1 10*3/uL (ref 4.0–10.5)

## 2012-10-22 LAB — BASIC METABOLIC PANEL
CO2: 25 mEq/L (ref 19–32)
Chloride: 103 mEq/L (ref 96–112)
Glucose, Bld: 97 mg/dL (ref 70–99)
Potassium: 3.7 mEq/L (ref 3.5–5.1)
Sodium: 137 mEq/L (ref 135–145)

## 2012-10-22 LAB — D-DIMER, QUANTITATIVE: D-Dimer, Quant: 0.35 ug/mL-FEU (ref 0.00–0.48)

## 2012-10-22 MED ORDER — ALBUTEROL SULFATE (5 MG/ML) 0.5% IN NEBU
5.0000 mg | INHALATION_SOLUTION | Freq: Once | RESPIRATORY_TRACT | Status: AC
  Administered 2012-10-22: 5 mg via RESPIRATORY_TRACT
  Filled 2012-10-22: qty 1

## 2012-10-22 MED ORDER — PREDNISONE 20 MG PO TABS
60.0000 mg | ORAL_TABLET | Freq: Once | ORAL | Status: AC
  Administered 2012-10-22: 60 mg via ORAL
  Filled 2012-10-22: qty 3

## 2012-10-22 MED ORDER — LORAZEPAM 1 MG PO TABS
1.0000 mg | ORAL_TABLET | Freq: Once | ORAL | Status: AC
  Administered 2012-10-22: 1 mg via ORAL
  Filled 2012-10-22: qty 1

## 2012-10-22 NOTE — ED Notes (Signed)
Pt states shortness of breath since last pm with chest discomfort.  Pt had baby in January.  Has also had 2 weeks of nasal congestion.  Pt states no cough.

## 2012-10-22 NOTE — ED Notes (Signed)
PA Albert at bedside. 

## 2012-10-22 NOTE — ED Provider Notes (Signed)
History     CSN: [id number]  Arrival date & time 10/22/12  2108   First MD Initiated Contact with Patient 10/22/12 2122      Chief Complaint  Patient presents with  . Shortness of Breath    (Consider location/radiation/quality/duration/timing/severity/associated sxs/prior treatment) HPI Comments: 22 year old female with no significant past medical history presents to the emergency department complaining of sudden onset shortness of breath beginning around 10:00 PM last night while sitting and watching TV. Shortness of breath has been constant since with associated chest pain. Describes the discomfort located in the center of her chest, non-radiating rated 8/10, worse with deep inspiration. She has not tried any alleviating factors. SOB present both at rest and on exertion. Admits to associated headache and nausea without vomiting. Admits to mild nasal congestion over the past two weeks, but denies fever, chills, cough, extremity pain/swelling. Gave birth 4 months ago. No exogenous estrogen, recent long travel, recent surgeries. Mom has a history of DVT.  Patient is a 22 y.o. female presenting with shortness of breath. The history is provided by the patient.  Shortness of Breath Associated symptoms: chest pain and headaches   Associated symptoms: no abdominal pain, no cough, no fever, no vomiting and no wheezing     Past Medical History  Diagnosis Date  . Infection     UTI X1  . 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 Infection     CHLAMYDIA  ?  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 Infection     [facility]    Past Surgical History  Procedure Laterality Date  . No past surgeries      Family History  Problem Relation Age of Onset  . Diabetes Maternal Grandmother   . Stroke Maternal Grandmother   . Pulmonary embolism Maternal Grandfather     History  Substance Use Topics  . Smoking status: Former Smoker -- 0.30 packs/day    Types: Cigarettes    Quit date: 11/28/2011  . Smokeless tobacco:  Never Used  . Alcohol Use: Yes     Comment: OCC MIXED DRINK;  NONE SINCE + UPT    OB History   Grav Para Term Preterm Abortions TAB SAB Ect Mult Living   1 1 1  0 0 0 0 0 0 1      Review of Systems  Constitutional: Negative for fever and chills.  Respiratory: Positive for shortness of breath. Negative for cough and wheezing.   Cardiovascular: Positive for chest pain. Negative for leg swelling.  Gastrointestinal: Positive for nausea. Negative for vomiting and abdominal pain.  Musculoskeletal: Negative for back pain.  Neurological: Positive for headaches. Negative for dizziness and light-headedness.  Psychiatric/Behavioral: Negative for confusion.  All other systems reviewed and are negative.    Allergies  Review of patient's allergies indicates no known allergies.  Home Medications   Current Outpatient Rx  Name  Route  Sig  Dispense  Refill  . docusate sodium (COLACE) 100 MG capsule   Oral   Take 1 capsule (100 mg total) by mouth 2 (two) times daily.   30 capsule   0   . ferrous sulfate (FERROUSUL) 325 (65 FE) MG tablet   Oral   Take 1 tablet (325 mg total) by mouth 3 (three) times daily with meals.   100 tablet   1   . Hydrocodone-Acetaminophen (VICODIN) 5-300 MG TABS      Pt to take 1-2 tablets every 4-6 hours prn   24 each  0   . ibuprofen (ADVIL,MOTRIN) 800 MG tablet   Oral   Take 1 tablet (800 mg total) by mouth every 8 (eight) hours as needed for pain.   50 tablet   1   . medroxyPROGESTERone (DEPO-PROVERA) 150 MG/ML injection   Intramuscular   Inject 1 mL (150 mg total) into the muscle every 3 (three) months.   1 mL   3   . oxyCODONE-acetaminophen (ROXICET) 5-325 MG per tablet   Oral   Take 1 tablet by mouth every 4 (four) hours as needed for pain.   30 tablet   0   . Prenatal Vit-Fe Fumarate-FA (GNP PRENATAL VITAMINS) 28-0.8 MG TABS   Oral   Take 0.8 mg by mouth daily.   100 tablet   3     BP 133/91  Pulse 103  Temp(Src) 99.5 F (37.5  C) (Oral)  Resp 18  SpO2 95%  LMP 09/13/2012  Physical Exam  Nursing note and vitals reviewed. Constitutional: She is oriented to person, place, and time. She appears well-developed and well-nourished. No distress.  HENT:  Head: Normocephalic and atraumatic.  Nose: Nose normal.  Mouth/Throat: Oropharynx is clear and moist.  Eyes: Conjunctivae and EOM are normal. Pupils are equal, round, and reactive to light.  Neck: Normal range of motion. Neck supple. No tracheal deviation present.  Cardiovascular: Regular rhythm, normal heart sounds and normal pulses.  Tachycardia present.   No extremity edema.  Pulmonary/Chest: Tachypnea noted. No respiratory distress. She has no decreased breath sounds. She has wheezes (mild, posterior mid-lung Derk, end-expiratory). She has no rhonchi. She has no rales. She exhibits no tenderness.  Abdominal: Soft. Bowel sounds are normal. There is no tenderness.  Musculoskeletal: Normal range of motion. She exhibits no edema.  Lymphadenopathy:    She has no cervical adenopathy.  Neurological: She is alert and oriented to person, place, and time.  Skin: Skin is warm and dry. She is not diaphoretic.  Psychiatric: She has a normal mood and affect. Her behavior is normal.    ED Course  Procedures (including critical care time)  Labs Reviewed  CBC  BASIC METABOLIC PANEL  D-DIMER, QUANTITATIVE    Date: 10/22/2012  Rate: 104  Rhythm: sinus tachycardia  QRS Axis: normal  Intervals: normal  ST/T Wave abnormalities: normal  Conduction Disutrbances:none  Narrative Interpretation: sinus tachy, no old EKG  Old EKG Reviewed: none available   Dg Chest 2 View  10/22/2012  *RADIOLOGY REPORT*  Clinical Data: Shortness of breath and wheezing.  CHEST - 2 VIEW  Comparison: None.  Findings: Lungs are clear.  Heart size is normal.  No pneumothorax or pleural fluid.  IMPRESSION: Negative chest.   Original Report Authenticated By: Holley Dexter, M.D.      1.  Shortness of breath   2. Stress       MDM  22 y/o female with sudden onset sob, pleuritic chest pain. Tachycardic, tachypneic. O2 sat 95% on RA. Very mild wheezing. Concern for PE. Low risk Wells' criteria of 1.5 points. Obtaining d-dimer, cbc, bmp, cxr. Giving albuterol breathing treatment.  10:36 PM D-dimer negative. Labs unremarkable. Patient reports very minimal change with albuterol treatment, agitated that respiratory therapist told her this would last for 4-6 hours. Husband telling patient to calm down. I believe some of patient's symptoms are coming from anxiety. Will give another breathing treatment along with prednisone PO and ativan.  12:11 AM Patient reports minimal relief. No longer tachypneic. After asking about stress, patient states she  has been staying in a hotel for the past 2 weeks with her baby while her husband was away, in process of looking for a new house. Stress management discussed. Resource guide given for PCP follow up. Stable for discharge. Case discussed with Dr. Ranae Palms who agrees with plan of care. Return precautions discussed. Patient states understanding of plan and is agreeable.     Trevor Mace, PA-C 10/23/12 [phone]

## 2012-10-22 NOTE — ED Notes (Signed)
Patient transported to X-ray 

## 2012-10-22 NOTE — ED Notes (Signed)
[id number]<WG> Expected date:<BR> Expected time:<BR> Means of arrival:<BR> Comments:<BR> closed

## 2012-10-23 NOTE — ED Provider Notes (Signed)
Medical screening examination/treatment/procedure(s) were performed by non-physician practitioner and as supervising physician I was immediately available for consultation/collaboration.   Loren Racer, MD 10/23/12 [phone]

## 2013-11-01 ENCOUNTER — Emergency Department (HOSPITAL_COMMUNITY): Payer: Self-pay

## 2013-11-01 ENCOUNTER — Encounter (HOSPITAL_COMMUNITY): Payer: Self-pay | Admitting: Emergency Medicine

## 2013-11-01 ENCOUNTER — Emergency Department (HOSPITAL_COMMUNITY)
Admission: EM | Admit: 2013-11-01 | Discharge: 2013-11-01 | Discharge status: Against Medical Advice | Payer: Medicaid Other | Attending: Emergency Medicine | Admitting: Emergency Medicine

## 2013-11-01 DIAGNOSIS — Z87440 Personal history of urinary (tract) infections: Secondary | ICD-10-CM | POA: Insufficient documentation

## 2013-11-01 DIAGNOSIS — Z8619 Personal history of other infectious and parasitic diseases: Secondary | ICD-10-CM | POA: Insufficient documentation

## 2013-11-01 DIAGNOSIS — R0789 Other chest pain: Secondary | ICD-10-CM | POA: Insufficient documentation

## 2013-11-01 DIAGNOSIS — R062 Wheezing: Secondary | ICD-10-CM | POA: Insufficient documentation

## 2013-11-01 DIAGNOSIS — Z79899 Other long term (current) drug therapy: Secondary | ICD-10-CM | POA: Insufficient documentation

## 2013-11-01 DIAGNOSIS — J069 Acute upper respiratory infection, unspecified: Secondary | ICD-10-CM | POA: Insufficient documentation

## 2013-11-01 DIAGNOSIS — Z87891 Personal history of nicotine dependence: Secondary | ICD-10-CM | POA: Insufficient documentation

## 2013-11-01 MED ORDER — IPRATROPIUM-ALBUTEROL 0.5-2.5 (3) MG/3ML IN SOLN
5.0000 mL | Freq: Once | RESPIRATORY_TRACT | Status: DC
Start: 2013-11-01 — End: 2013-11-01
  Filled 2013-11-01: qty 3

## 2013-11-01 NOTE — ED Provider Notes (Signed)
Medical screening examination/treatment/procedure(s) were performed by non-physician practitioner and as supervising physician I was immediately available for consultation/collaboration.   EKG Interpretation None        Cliffton Spradley N Akiyah Eppolito, DO 11/01/13 2321 

## 2013-11-01 NOTE — ED Notes (Signed)
Pt not in room to received neb tx.

## 2013-11-01 NOTE — ED Provider Notes (Signed)
CSN: [id number]     Arrival date & time 11/01/13  1453 History  This chart was scribed for non-physician practitioner, Clabe SealLauren M Lechelle Wrigley, PA-C, working with Layla MawKristen N Ward, DO by Charline BillsEssence Howell, ED Scribe. This patient was seen in room WTR6/WTR6 and the patient's care was started at 4:49 PM.   Chief Complaint  Patient presents with  . Shortness of Breath  . Cough  . Sneezing    HPI Comments: Jeanne [name] is a 23 y.o. female who presents to the Emergency Department complaining of dry cough onset 1 month ago. Pt reports associated sneezing, nasal congestion, SOB, subjective fever. She reports chest discomfort described as tightness for several weeks. Pt tried Sudafed, Claritin, Mucinex, Nyquil last night, without full resolution of symptoms. She has not taken anything for the past month other than last. Pt has h/o environmental allergies. Pt denies h/o asthma.  NO PCP  The history is provided by the patient. No language interpreter was used.   Past Medical History  Diagnosis Date  . Infection     UTI X1  . 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 Infection     CHLAMYDIA  ?  Marland Kitchen. 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   Past Surgical History  Procedure Laterality Date  . No past surgeries     Family History  Problem Relation Age of Onset  . Diabetes Maternal Grandmother   . Stroke Maternal Grandmother   . Pulmonary embolism Maternal Grandfather    History  Substance Use Topics  . Smoking status: Former Smoker -- 0.30 packs/day    Types: Cigarettes    Quit date: 11/28/2011  . Smokeless tobacco: Never Used  . Alcohol Use: Yes     Comment: OCC MIXED DRINK;  NONE SINCE + UPT   OB History   Grav Para Term Preterm Abortions TAB SAB Ect Mult Living   1 1 1  0 0 0 0 0 0 1     Review of Systems  Constitutional: Positive for fever.  HENT: Positive for congestion and sneezing.   Eyes: Negative for discharge.  Respiratory: Positive for cough, chest tightness, shortness of breath and  wheezing.   Skin: Negative for rash.  Allergic/Immunologic: Positive for environmental allergies.  All other systems reviewed and are negative.  Allergies  Review of patient's allergies indicates no known allergies.  Home Medications   Prior to Admission medications   Medication Sig Start Date End Date Taking? Authorizing Provider  Prenatal Vit-Fe Fumarate-FA (PRENATAL MULTIVITAMIN) TABS tablet Take 1 tablet by mouth daily.   Yes Historical Provider, MD   Triage Vitals: BP 118/68  Pulse 90  Temp(Src) 99.1 F (37.3 C) (Oral)  Resp 20  SpO2 98% Physical Exam  Nursing note and vitals reviewed. Constitutional: She is oriented to person, place, and time. She appears well-developed and well-nourished. She is cooperative.  Non-toxic appearance. She does not have a sickly appearance. She does not appear ill. No distress.  HENT:  Head: Normocephalic and atraumatic.  Right Ear: Tympanic membrane normal. Tympanic membrane is not bulging.  Left Ear: Tympanic membrane normal. Tympanic membrane is not bulging.  Nose: Rhinorrhea present.  Mouth/Throat: Uvula is midline and mucous membranes are normal. No trismus in the jaw. Posterior oropharyngeal erythema present. No oropharyngeal exudate or posterior oropharyngeal edema.  Eyes: Conjunctivae and EOM are normal. Pupils are equal, round, and reactive to light. Right eye exhibits no discharge. Left eye exhibits no discharge.  Neck: Normal range of motion. Neck supple.  Cardiovascular: Normal rate, regular rhythm and normal heart sounds.   Pulmonary/Chest: Effort normal. She has wheezes. She has no rales.  Musculoskeletal: Normal range of motion.  Lymphadenopathy:       Head (right side): No submental, no submandibular, no tonsillar, no preauricular, no posterior auricular and no occipital adenopathy present.       Head (left side): No submental, no submandibular, no tonsillar, no preauricular, no posterior auricular and no occipital adenopathy  present.       Right cervical: No superficial cervical adenopathy present.      Left cervical: No superficial cervical adenopathy present.  Neurological: She is alert and oriented to person, place, and time.  Skin: Skin is warm and dry. She is not diaphoretic.  Psychiatric: She has a normal mood and affect. Her behavior is normal.   ED Course  Procedures (including critical care time)  COORDINATION OF CARE: 4:56 PM-Discussed treatment plan with pt at bedside and pt agreed to plan.   Labs Review Labs Reviewed - No data to display  Imaging Review No results found.   EKG Interpretation None      MDM   Final diagnoses:  Wheezing  Upper respiratory infection   Patient presents with a cough ongoing for one month. Initial temperature was 99.1, 98% on room air. On exam she has wheezing bilaterally, will rule out pneumonia with x-ray. DuoNeb ordered chest x-ray ordered. Discussed treatment plan with the patient.  Prior to x-ray and breathing treatment the patient left the emergency department without notifying the staff.  Meds given in ED:  Medications  ipratropium-albuterol (DUONEB) 0.5-2.5 (3) MG/3ML nebulizer solution 5 mL (not administered)    Discharge Medication List as of 11/01/2013  5:24 PM     I personally performed the services described in this documentation, which was scribed in my presence. The recorded information has been reviewed and is accurate.    Clabe SealLauren M Tryson Lumley, PA-C 11/01/13 2022

## 2013-11-01 NOTE — ED Notes (Signed)
Pt states that she has been having productive cough, nasal congestion, sneezing, SOB x 1 month.

## 2013-11-01 NOTE — Progress Notes (Signed)
[facility] CL provided pt with a list of primary care resources to help patient establish primary care.  °

## 2013-11-01 NOTE — ED Notes (Signed)
Pt ambulatory to exam room with steady gait. Pt has no acute distress.  

## 2013-11-01 NOTE — ED Notes (Signed)
Pt not in room for further tx. Assumed pt and pt's family has left ED.

## 2013-11-02 ENCOUNTER — Emergency Department (HOSPITAL_COMMUNITY): Payer: Medicaid Other

## 2013-11-02 ENCOUNTER — Emergency Department (HOSPITAL_COMMUNITY)
Admission: EM | Admit: 2013-11-02 | Discharge: 2013-11-02 | Disposition: A | Discharge status: Home / Self Care | Payer: Medicaid Other | Attending: Emergency Medicine | Admitting: Emergency Medicine

## 2013-11-02 ENCOUNTER — Encounter (HOSPITAL_COMMUNITY): Payer: Self-pay | Admitting: Emergency Medicine

## 2013-11-02 DIAGNOSIS — Z87440 Personal history of urinary (tract) infections: Secondary | ICD-10-CM | POA: Insufficient documentation

## 2013-11-02 DIAGNOSIS — Z87891 Personal history of nicotine dependence: Secondary | ICD-10-CM | POA: Insufficient documentation

## 2013-11-02 DIAGNOSIS — Z3202 Encounter for pregnancy test, result negative: Secondary | ICD-10-CM | POA: Insufficient documentation

## 2013-11-02 DIAGNOSIS — Z8619 Personal history of other infectious and parasitic diseases: Secondary | ICD-10-CM | POA: Insufficient documentation

## 2013-11-02 DIAGNOSIS — J9801 Acute bronchospasm: Secondary | ICD-10-CM | POA: Insufficient documentation

## 2013-11-02 LAB — URINE MICROSCOPIC-ADD ON

## 2013-11-02 LAB — CBC WITH DIFFERENTIAL/PLATELET
BASOS ABS: 0 10*3/uL (ref 0.0–0.1)
BASOS PCT: 0 % (ref 0–1)
EOS ABS: 1.2 10*3/uL — AB (ref 0.0–0.7)
Eosinophils Relative: 10 % — ABNORMAL HIGH (ref 0–5)
HCT: 43.9 % (ref 36.0–46.0)
HEMOGLOBIN: 15.2 g/dL — AB (ref 12.0–15.0)
Lymphocytes Relative: 28 % (ref 12–46)
Lymphs Abs: 3.3 10*3/uL (ref 0.7–4.0)
MCH: 30.5 pg (ref 26.0–34.0)
MCHC: 34.6 g/dL (ref 30.0–36.0)
MCV: 88.2 fL (ref 78.0–100.0)
MONOS PCT: 8 % (ref 3–12)
Monocytes Absolute: 0.9 10*3/uL (ref 0.1–1.0)
NEUTROS PCT: 54 % (ref 43–77)
Neutro Abs: 6.1 10*3/uL (ref 1.7–7.7)
PLATELETS: 242 10*3/uL (ref 150–400)
RBC: 4.98 MIL/uL (ref 3.87–5.11)
RDW: 14 % (ref 11.5–15.5)
WBC: 11.5 10*3/uL — ABNORMAL HIGH (ref 4.0–10.5)

## 2013-11-02 LAB — BASIC METABOLIC PANEL
BUN: 8 mg/dL (ref 6–23)
CO2: 21 mEq/L (ref 19–32)
CREATININE: 0.74 mg/dL (ref 0.50–1.10)
Calcium: 9.5 mg/dL (ref 8.4–10.5)
Chloride: 104 mEq/L (ref 96–112)
Glucose, Bld: 100 mg/dL — ABNORMAL HIGH (ref 70–99)
POTASSIUM: 4.1 meq/L (ref 3.7–5.3)
Sodium: 139 mEq/L (ref 137–147)

## 2013-11-02 LAB — RAPID URINE DRUG SCREEN, HOSP PERFORMED
Amphetamines: NOT DETECTED
BARBITURATES: NOT DETECTED
Benzodiazepines: NOT DETECTED
Cocaine: NOT DETECTED
Opiates: NOT DETECTED
Tetrahydrocannabinol: NOT DETECTED

## 2013-11-02 LAB — URINALYSIS, ROUTINE W REFLEX MICROSCOPIC
BILIRUBIN URINE: NEGATIVE
Glucose, UA: NEGATIVE mg/dL
Hgb urine dipstick: NEGATIVE
KETONES UR: NEGATIVE mg/dL
NITRITE: NEGATIVE
Protein, ur: NEGATIVE mg/dL
SPECIFIC GRAVITY, URINE: 1.002 — AB (ref 1.005–1.030)
UROBILINOGEN UA: 0.2 mg/dL (ref 0.0–1.0)
pH: 6 (ref 5.0–8.0)

## 2013-11-02 LAB — PREGNANCY, URINE: PREG TEST UR: NEGATIVE

## 2013-11-02 MED ORDER — ALBUTEROL (5 MG/ML) CONTINUOUS INHALATION SOLN
10.0000 mg/h | INHALATION_SOLUTION | RESPIRATORY_TRACT | Status: DC
  Administered 2013-11-02: 10 mg/h via RESPIRATORY_TRACT
  Filled 2013-11-02: qty 20

## 2013-11-02 MED ORDER — IPRATROPIUM-ALBUTEROL 0.5-2.5 (3) MG/3ML IN SOLN
3.0000 mL | Freq: Once | RESPIRATORY_TRACT | Status: DC
Start: 2013-11-02 — End: 2013-11-02
  Filled 2013-11-02: qty 3

## 2013-11-02 MED ORDER — PREDNISONE 20 MG PO TABS: 60.0000 mg | ORAL_TABLET | Freq: Every day | ORAL | Status: DC

## 2013-11-02 MED ORDER — SODIUM CHLORIDE 0.9 % IV BOLUS (SEPSIS)
1000.0000 mL | Freq: Once | INTRAVENOUS | Status: AC
  Administered 2013-11-02: 1000 mL via INTRAVENOUS

## 2013-11-02 MED ORDER — ALBUTEROL SULFATE HFA 108 (90 BASE) MCG/ACT IN AERS: 2.0000 | INHALATION_SPRAY | Freq: Four times a day (QID) | RESPIRATORY_TRACT | Status: DC | PRN

## 2013-11-02 MED ORDER — METHYLPREDNISOLONE SODIUM SUCC 125 MG IJ SOLR
125.0000 mg | Freq: Once | INTRAMUSCULAR | Status: AC
  Administered 2013-11-02: 125 mg via INTRAVENOUS
  Filled 2013-11-02: qty 2

## 2013-11-02 MED ORDER — ALBUTEROL (5 MG/ML) CONTINUOUS INHALATION SOLN
10.0000 mg/h | INHALATION_SOLUTION | Freq: Once | RESPIRATORY_TRACT | Status: AC
Start: 2013-11-02 — End: 2013-11-02
  Administered 2013-11-02: 10 mg/h via RESPIRATORY_TRACT

## 2013-11-02 NOTE — ED Provider Notes (Signed)
Patient seen at the checkout from Dr. Norlene Campbelltter. She is comfortable now. States that she feels better after the treatment. Her breathing is better. She is able to ambulate with a normal oxygen saturation. Lungs have good air movement bilaterally without significant wheezes, rales, or rhonchi.   Patient states she does not have a primary care doctor. She will be referred to the wellness clinic. She does have been written for prednisone, and albuterol inhaler.  Assessment- bronchospasm, likely secondary to environmental allergies. Possible asthma. The patient does not have chronic respiratory symptoms. She is stable for discharge with outpatient management and followup with a primary care doctor, in one or 2 weeks.  Flint MelterElliott L Shiquita Collignon, MD 11/02/13 1009

## 2013-11-02 NOTE — ED Notes (Signed)
Per pt report: pt was seen earlier and left.  Pt came back this evening with a c/o shortness of breath.  Pt report recent issues with allergies that cause her have shortness of breath.

## 2013-11-02 NOTE — ED Provider Notes (Signed)
CSN: [id number]     Arrival date & time 11/02/13  [id number] History   First MD Initiated Contact with Patient 11/02/13 [phone]     Chief Complaint  Patient presents with  . Shortness of Breath     (Consider location/radiation/quality/duration/timing/severity/associated sxs/prior Treatment) HPI 23 year old female presents to emergency room from home with complaint of worsening shortness of breath upon waking.  Patient speaking in short sentences, moderate respiratory stress.  Patient was in the emergency room earlier today, but was frustrated with the time was taking to evaluate her and treat her as she left without telling staff.  Patient reports she's had cough, watery eyes itching sneezing over the last month.  No prior history of asthma. Past Medical History  Diagnosis Date  . Infection     UTI X1  . 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 Infection     CHLAMYDIA  ?  Marland Kitchen. 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   Past Surgical History  Procedure Laterality Date  . No past surgeries     Family History  Problem Relation Age of Onset  . Diabetes Maternal Grandmother   . Stroke Maternal Grandmother   . Pulmonary embolism Maternal Grandfather    History  Substance Use Topics  . Smoking status: Former Smoker -- 0.30 packs/day    Types: Cigarettes    Quit date: 11/28/2011  . Smokeless tobacco: Never Used  . Alcohol Use: Yes     Comment: OCC MIXED DRINK;  NONE SINCE + UPT   OB History   Grav Para Term Preterm Abortions TAB SAB Ect Mult Living   1 1 1  0 0 0 0 0 0 1     Review of Systems   See History of Present Illness; otherwise all other systems are reviewed and negative  Allergies  Review of patient's allergies indicates no known allergies.  Home Medications   Prior to Admission medications   Medication Sig Start Date End Date Taking? Authorizing Provider  Prenatal Vit-Fe Fumarate-FA (PRENATAL MULTIVITAMIN) TABS tablet Take 1 tablet by mouth daily.    Historical Provider, MD    BP 123/72  Pulse 108  Temp(Src) 98.2 F (36.8 C) (Oral)  Resp 22  SpO2 95% Physical Exam  Nursing note and vitals reviewed. Constitutional: She is oriented to person, place, and time. She appears well-developed and well-nourished. She appears distressed.  HENT:  Head: Normocephalic and atraumatic.  Right Ear: External ear normal.  Left Ear: External ear normal.  Nose: Nose normal.  Mouth/Throat: Oropharynx is clear and moist.  Eyes: Conjunctivae and EOM are normal. Pupils are equal, round, and reactive to light.  Neck: Normal range of motion. Neck supple. No JVD present. No tracheal deviation present. No thyromegaly present.  Cardiovascular: Normal rate, regular rhythm, normal heart sounds and intact distal pulses.  Exam reveals no gallop and no friction rub.   No murmur heard. Pulmonary/Chest: No stridor. She is in respiratory distress (moderate). She has wheezes. She has no rales. She exhibits no tenderness.  Abdominal: Soft. Bowel sounds are normal. She exhibits no distension and no mass. There is no tenderness. There is no rebound and no guarding.  Musculoskeletal: Normal range of motion. She exhibits no edema and no tenderness.  Lymphadenopathy:    She has no cervical adenopathy.  Neurological: She is alert and oriented to person, place, and time. She exhibits normal muscle tone. Coordination normal.  Skin: Skin is warm and  dry. No rash noted. No erythema. No pallor.  Psychiatric: She has a normal mood and affect. Her behavior is normal. Judgment and thought content normal.    ED Course  Procedures (including critical care time) Labs Review Labs Reviewed  CBC WITH DIFFERENTIAL - Abnormal; Notable for the following:    WBC 11.5 (*)    Hemoglobin 15.2 (*)    Eosinophils Relative 10 (*)    Eosinophils Absolute 1.2 (*)    All other components within normal limits  URINALYSIS, ROUTINE W REFLEX MICROSCOPIC - Abnormal; Notable for the following:    Specific Gravity, Urine  1.002 (*)    Leukocytes, UA TRACE (*)    All other components within normal limits  URINE RAPID DRUG SCREEN (HOSP PERFORMED)  PREGNANCY, URINE  URINE MICROSCOPIC-ADD ON  BASIC METABOLIC PANEL    Imaging Review Dg Chest Port 1 View  11/02/2013   CLINICAL DATA:  Shortness of breath and wheezing.  EXAM: PORTABLE CHEST - 1 VIEW  COMPARISON:  Chest x-ray 10/22/2012.  FINDINGS: Lung volumes are normal. No consolidative airspace disease. No pleural effusions. No pneumothorax. No pulmonary nodule or mass noted. Pulmonary vasculature and the cardiomediastinal silhouette are within normal limits.  IMPRESSION: No radiographic evidence of acute cardiopulmonary disease.   Electronically Signed   By: Trudie Reedaniel  Entrikin M.D.   On: 11/02/2013 04:34     EKG Interpretation None      MDM   Final diagnoses:  Bronchospasm    23 year old female with bronchospasm, moderate respiratory distress with poor air movement, expiratory wheezing.  Plan for continuous nebulized treatment, Solu-Medrol.  We'll check labs x-ray.  6:28 AM Pt has finished hour neb, much improved, breathing easier.  Still with wheezing, will get second continuous neb.  Pt requesting treatment for her anxiety attack, but explained to patient I felt her sxs were more due to her bronchospasm than anxiety.  Olivia Mackielga M Duff Pozzi, MD 11/02/13 0630

## 2013-11-02 NOTE — Discharge Instructions (Signed)
°Bronchospasm, Adult °A bronchospasm is a spasm or tightening of the airways going into the lungs. During a bronchospasm breathing becomes more difficult because the airways get smaller. When this happens there can be coughing, a whistling sound when breathing (wheezing), and difficulty breathing. Bronchospasm is often associated with asthma, but not all patients who experience a bronchospasm have asthma. °CAUSES  °A bronchospasm is caused by inflammation or irritation of the airways. The inflammation or irritation may be triggered by:  °· Allergies (such as to animals, pollen, food, or mold). Allergens that cause bronchospasm may cause wheezing immediately after exposure or many hours later.   °· Infection. Viral infections are believed to be the most common cause of bronchospasm.   °· Exercise.   °· Irritants (such as pollution, cigarette smoke, strong odors, aerosol sprays, and paint fumes).   °· Weather changes. Winds increase molds and pollens in the air. Rain refreshes the air by washing irritants out. Cold air may cause inflammation.   °· Stress and emotional upset.   °SIGNS AND SYMPTOMS  °· Wheezing.   °· Excessive nighttime coughing.   °· Frequent or severe coughing with a simple cold.   °· Chest tightness.   °· Shortness of breath.   °DIAGNOSIS  °Bronchospasm is usually diagnosed through a history and physical exam. Tests, such as chest X-rays, are sometimes done to look for other conditions. °TREATMENT  °· Inhaled medicines can be given to open up your airways and help you breathe. The medicines can be given using either an inhaler or a nebulizer machine. °· Corticosteroid medicines may be given for severe bronchospasm, usually when it is associated with asthma. °HOME CARE INSTRUCTIONS  °· Always have a plan prepared for seeking medical care. Know when to call your health care provider and local emergency services (911 in the [place] Know where you can access local emergency care. °· Only take medicines  as directed by your health care provider. °· If you were prescribed an inhaler or nebulizer machine, ask your health care provider to explain how to use it correctly. Always use a spacer with your inhaler if you were given one. °· It is necessary to remain calm during an attack. Try to relax and breathe more slowly.  °· Control your home environment in the following ways:   °· Change your heating and air conditioning filter at least once a month.   °· Limit your use of fireplaces and wood stoves. °· Do not smoke and do not allow smoking in your home.   °· Avoid exposure to perfumes and fragrances.   °· Get rid of pests (such as roaches and mice) and their droppings.   °· Throw away plants if you see mold on them.   °· Keep your house clean and dust free.   °· Replace carpet with wood, tile, or vinyl flooring. Carpet can trap dander and dust.   °· Use allergy-proof pillows, mattress covers, and box spring covers.   °· Wash bed sheets and blankets every week in hot water and dry them in a dryer.   °· Use blankets that are made of polyester or cotton.   °· Wash hands frequently. °SEEK MEDICAL CARE IF:  °· You have muscle aches.   °· You have chest pain.   °· The sputum changes from clear or white to yellow, green, gray, or bloody.   °· The sputum you cough up gets thicker.   °· There are problems that may be related to the medicine you are given, such as a rash, itching, swelling, or trouble breathing.   °SEEK IMMEDIATE MEDICAL CARE IF:  °· You have worsening wheezing and coughing   even after taking your prescribed medicines.   °· You have increased difficulty breathing.   °· You develop severe chest pain. °MAKE SURE YOU:  °· Understand these instructions. °· Will watch your condition. °· Will get help right away if you are not doing well or get worse. °Document Released: 06/04/2003 Document Revised: 02/01/2013 Document Reviewed: 11/21/2012 °ExitCare® Patient Information ©2014 [employer]. ° °How to Use an  Inhaler °Proper inhaler technique is very important. Good technique ensures that the medicine reaches the lungs. Poor technique results in depositing the medicine on the tongue and back of the throat rather than in the airways. If you do not use the inhaler with good technique, the medicine will not help you. °STEPS TO FOLLOW IF USING AN INHALER WITHOUT AN EXTENSION TUBE °1. Remove the cap from the inhaler. °2. If you are using the inhaler for the first time, you will need to prime it. Shake the inhaler for 5 seconds and release four puffs into the air, away from your face. Ask your health care provider or pharmacist if you have questions about priming your inhaler. °3. Shake the inhaler for 5 seconds before each breath in (inhalation). °4. Position the inhaler so that the top of the canister faces up. °5. Put your index finger on the top of the medicine canister. Your thumb supports the bottom of the inhaler. °6. Open your mouth. °7. Either place the inhaler between your teeth and place your lips tightly around the mouthpiece, or hold the inhaler 1 2 inches away from your open mouth. If you are unsure of which technique to use, ask your health care provider. °8. Breathe out (exhale) normally and as completely as possible. °9. Press the canister down with your index finger to release the medicine. °10. At the same time as the canister is pressed, inhale deeply and slowly until your lungs are completely filled. This should take 4 6 seconds. Keep your tongue down. °11. Hold the medicine in your lungs for 5 10 seconds (10 seconds is best). This helps the medicine get into the small airways of your lungs. °12. Breathe out slowly, through pursed lips. Whistling is an example of pursed lips. °13. Wait at least 15 30 seconds between puffs. Continue with the above steps until you have taken the number of puffs your health care provider has ordered. Do not use the inhaler more than your health care provider tells  you. °14. Replace the cap on the inhaler. °15. Follow the directions from your health care provider or the inhaler insert for cleaning the inhaler. °STEPS TO FOLLOW IF USING AN INHALER WITH AN EXTENSION (SPACER) °1. Remove the cap from the inhaler. °2. If you are using the inhaler for the first time, you will need to prime it. Shake the inhaler for 5 seconds and release four puffs into the air, away from your face. Ask your health care provider or pharmacist if you have questions about priming your inhaler. °3. Shake the inhaler for 5 seconds before each breath in (inhalation). °4. Place the open end of the spacer onto the mouthpiece of the inhaler. °5. Position the inhaler so that the top of the canister faces up and the spacer mouthpiece faces you. °6. Put your index finger on the top of the medicine canister. Your thumb supports the bottom of the inhaler and the spacer. °7. Breathe out (exhale) normally and as completely as possible. °8. Immediately after exhaling, place the spacer between your teeth and into your mouth. Close your   lips tightly around the spacer. °9. Press the canister down with your index finger to release the medicine. °10. At the same time as the canister is pressed, inhale deeply and slowly until your lungs are completely filled. This should take 4 6 seconds. Keep your tongue down and out of the way. °11. Hold the medicine in your lungs for 5 10 seconds (10 seconds is best). This helps the medicine get into the small airways of your lungs. Exhale. °12. Repeat inhaling deeply through the spacer mouthpiece. Again hold that breath for up to 10 seconds (10 seconds is best). Exhale slowly. If it is difficult to take this second deep breath through the spacer, breathe normally several times through the spacer. Remove the spacer from your mouth. °13. Wait at least 15 30 seconds between puffs. Continue with the above steps until you have taken the number of puffs your health care provider has  ordered. Do not use the inhaler more than your health care provider tells you. °14. Remove the spacer from the inhaler, and place the cap on the inhaler. °15. Follow the directions from your health care provider or the inhaler insert for cleaning the inhaler and spacer. °If you are using different kinds of inhalers, use your quick relief medicine to open the airways 10 15 minutes before using a steroid if instructed to do so by your health care provider. If you are unsure which inhalers to use and the order of using them, ask your health care provider, nurse, or respiratory therapist. °If you are using a steroid inhaler, always rinse your mouth with water after your last puff, then gargle and spit out the water. Do not swallow the water. °AVOID: °· Inhaling before or after starting the spray of medicine. It takes practice to coordinate your breathing with triggering the spray. °· Inhaling through the nose (rather than the mouth) when triggering the spray. °HOW TO DETERMINE IF YOUR INHALER IS FULL OR NEARLY EMPTY °You cannot know when an inhaler is empty by shaking it. A few inhalers are now being made with dose counters. Ask your health care provider for a prescription that has a dose counter if you feel you need that extra help. If your inhaler does not have a counter, ask your health care provider to help you determine the date you need to refill your inhaler. Write the refill date on a calendar or your inhaler canister. Refill your inhaler 7 10 days before it runs out. Be sure to keep an adequate supply of medicine. This includes making sure it is not expired, and that you have a spare inhaler.  °SEEK MEDICAL CARE IF:  °· Your symptoms are only partially relieved with your inhaler. °· You are having trouble using your inhaler. °· You have some increase in phlegm. °SEEK IMMEDIATE MEDICAL CARE IF:  °· You feel little or no relief with your inhalers. You are still wheezing and are feeling shortness of breath or  tightness in your chest or both. °· You have dizziness, headaches, or a fast heart rate. °· You have chills, fever, or night sweats. °· You have a noticeable increase in phlegm production, or there is blood in the phlegm. °MAKE SURE YOU:  °· Understand these instructions. °· Will watch your condition. °· Will get help right away if you are not doing well or get worse. °Document Released: 05/29/2000 Document Revised: 03/22/2013 Document Reviewed: 12/29/2012 °ExitCare® Patient Information ©2014 [employer]. ° ° °

## 2013-11-02 NOTE — ED Notes (Signed)
RT at bedside for CAT ?

## 2013-11-02 NOTE — ED Notes (Addendum)
Pt walked around hallway using portable O2 sat monitor per MD request. Pt able to maintain O2 saturation levels between 90-94% throughout walk. Pt vital signs currently stable. Pt ready for re-eval.

## 2014-04-16 ENCOUNTER — Encounter (HOSPITAL_COMMUNITY): Payer: Self-pay | Admitting: Emergency Medicine

## 2015-10-15 ENCOUNTER — Encounter (HOSPITAL_COMMUNITY): Payer: Self-pay | Admitting: *Deleted

## 2015-10-15 ENCOUNTER — Emergency Department (HOSPITAL_COMMUNITY)
Admission: EM | Admit: 2015-10-15 | Discharge: 2015-10-15 | Disposition: A | Discharge status: Home / Self Care | Payer: Medicaid Other | Attending: Emergency Medicine | Admitting: Emergency Medicine

## 2015-10-15 DIAGNOSIS — Z79899 Other long term (current) drug therapy: Secondary | ICD-10-CM | POA: Diagnosis not present

## 2015-10-15 DIAGNOSIS — Z8619 Personal history of other infectious and parasitic diseases: Secondary | ICD-10-CM | POA: Insufficient documentation

## 2015-10-15 DIAGNOSIS — J45901 Unspecified asthma with (acute) exacerbation: Secondary | ICD-10-CM

## 2015-10-15 DIAGNOSIS — Z7952 Long term (current) use of systemic steroids: Secondary | ICD-10-CM | POA: Diagnosis not present

## 2015-10-15 DIAGNOSIS — Z87891 Personal history of nicotine dependence: Secondary | ICD-10-CM | POA: Diagnosis not present

## 2015-10-15 DIAGNOSIS — Z87440 Personal history of urinary (tract) infections: Secondary | ICD-10-CM | POA: Diagnosis not present

## 2015-10-15 DIAGNOSIS — R05 Cough: Secondary | ICD-10-CM | POA: Diagnosis present

## 2015-10-15 HISTORY — DX: Unspecified asthma, uncomplicated: J45.909

## 2015-10-15 MED ORDER — ALBUTEROL SULFATE HFA 108 (90 BASE) MCG/ACT IN AERS
1.0000 | INHALATION_SPRAY | Freq: Once | RESPIRATORY_TRACT | Status: AC
  Administered 2015-10-15: 1 via RESPIRATORY_TRACT
  Filled 2015-10-15: qty 6.7

## 2015-10-15 MED ORDER — PREDNISONE 20 MG PO TABS: 60.0000 mg | ORAL_TABLET | Freq: Every day | ORAL | Status: DC

## 2015-10-15 MED ORDER — ALBUTEROL SULFATE HFA 108 (90 BASE) MCG/ACT IN AERS: 1.0000 | INHALATION_SPRAY | Freq: Four times a day (QID) | RESPIRATORY_TRACT | Status: DC | PRN

## 2015-10-15 MED ORDER — IPRATROPIUM BROMIDE 0.02 % IN SOLN
0.5000 mg | Freq: Once | RESPIRATORY_TRACT | Status: AC
  Administered 2015-10-15: 0.5 mg via RESPIRATORY_TRACT
  Filled 2015-10-15: qty 2.5

## 2015-10-15 MED ORDER — PREDNISONE 20 MG PO TABS
60.0000 mg | ORAL_TABLET | Freq: Once | ORAL | Status: AC
  Administered 2015-10-15: 60 mg via ORAL
  Filled 2015-10-15: qty 3

## 2015-10-15 MED ORDER — IBUPROFEN 200 MG PO TABS: 600.0000 mg | ORAL_TABLET | Freq: Four times a day (QID) | ORAL | Status: DC | PRN

## 2015-10-15 MED ORDER — ALBUTEROL (5 MG/ML) CONTINUOUS INHALATION SOLN
10.0000 mg/h | INHALATION_SOLUTION | Freq: Once | RESPIRATORY_TRACT | Status: AC
  Administered 2015-10-15: 10 mg/h via RESPIRATORY_TRACT
  Filled 2015-10-15: qty 20

## 2015-10-15 MED ORDER — ALBUTEROL SULFATE (2.5 MG/3ML) 0.083% IN NEBU
5.0000 mg | INHALATION_SOLUTION | Freq: Once | RESPIRATORY_TRACT | Status: AC
  Administered 2015-10-15: 5 mg via RESPIRATORY_TRACT
  Filled 2015-10-15: qty 6

## 2015-10-15 NOTE — ED Notes (Signed)
[place] has [place] having problems with her asthma.  Pt st's she had a inhaler approx 1 year ago but hasn't needed one until now.

## 2015-10-15 NOTE — ED Provider Notes (Signed)
CSN: [id number]     Arrival date & time 10/15/15  2011 History   First MD Initiated Contact with Patient 10/15/15 2100     Chief Complaint  Patient presents with  . Asthma   (Consider location/radiation/quality/duration/timing/severity/associated sxs/prior Treatment) HPI 25 y.o. female with a hx of Asthma, presents to the Emergency Department today due to acute asthma exacerbation despite rescue inhaler treatments. Notes symptoms began x2 days ago while outside. States most likely due to pollen. Last exacerbation x1 year ago. Notes chest tightness due to cough. No pain. No fevers. No CP/ABD pain. No N/V/D. No other symptoms noted.   Past Medical History  Diagnosis Date  . Infection     UTI X1  . 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 Infection     CHLAMYDIA  ?  Marland Kitchen. 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 Infection     [facility]  . Asthma    Past Surgical History  Procedure Laterality Date  . No past surgeries     Family History  Problem Relation Age of Onset  . Diabetes Maternal Grandmother   . Stroke Maternal Grandmother   . Pulmonary embolism Maternal Grandfather    Social History  Substance Use Topics  . Smoking status: Former Smoker -- 0.30 packs/day    Types: Cigarettes    Quit date: 11/28/2011  . Smokeless tobacco: Never Used  . Alcohol Use: Yes     Comment: OCC MIXED DRINK;  NONE SINCE + UPT   OB History    Gravida Para Term Preterm AB TAB SAB Ectopic Multiple Living   1 1 1  0 0 0 0 0 0 1     Review of Systems ROS reviewed and all are negative for acute change except as noted in the HPI.  Allergies  Pollen extract  Home Medications   Prior to Admission medications   Medication Sig Start Date End Date Taking? Authorizing Provider  albuterol (PROVENTIL HFA;VENTOLIN HFA) 108 (90 BASE) MCG/ACT inhaler Inhale 2 puffs into the lungs every 6 (six) hours as needed for wheezing or shortness of breath. 11/02/13   Marisa Severinlga Otter, MD  ibuprofen (ADVIL,MOTRIN) 200 MG tablet Take 600 mg by mouth  every 6 (six) hours as needed for moderate pain.    Historical Provider, MD  loratadine (CLARITIN) 10 MG tablet Take 10 mg by mouth daily.    Historical Provider, MD  Phenylephrine-APAP-Guaifenesin (MUCINEX FAST-MAX COLD & SINUS) 10-650-400 MG/20ML LIQD Take 30 mLs by mouth once.    Historical Provider, MD  predniSONE (DELTASONE) 20 MG tablet Take 3 tablets (60 mg total) by mouth daily. 11/02/13   Marisa Severinlga Otter, MD  Prenatal Vit-Fe Fumarate-FA (PRENATAL MULTIVITAMIN) TABS tablet Take 1 tablet by mouth daily.    Historical Provider, MD  pseudoephedrine (SUDAFED) 30 MG tablet Take 30 mg by mouth every 4 (four) hours as needed for congestion.    Historical Provider, MD   BP 126/90 mmHg  Pulse 98  Temp(Src) 98.3 F (36.8 C) (Oral)  Resp 18  Ht 5\' 6"  (1.676 m)  Wt 117.085 kg  BMI 41.68 kg/m2  SpO2 95%   Physical Exam  Constitutional: She is oriented to person, place, and time. She appears well-developed and well-nourished.  HENT:  Head: Normocephalic and atraumatic.  Eyes: EOM are normal. Pupils are equal, round, and reactive to light.  Neck: Normal range of motion. Neck supple. No tracheal deviation present.  Cardiovascular: Normal rate, regular rhythm, normal heart sounds and intact distal pulses.  No murmur heard. Pulmonary/Chest: Effort normal. No accessory muscle usage. No respiratory distress. She has no decreased breath sounds. She has wheezes in the right upper field, the right lower field, the left upper field and the left lower field. She has no rhonchi. She has no rales.  Abdominal: Soft.  Musculoskeletal: Normal range of motion.  Neurological: She is alert and oriented to person, place, and time.  Skin: Skin is warm and dry.  Psychiatric: She has a normal mood and affect. Her behavior is normal. Thought content normal.  Nursing note and vitals reviewed.  ED Course  Procedures (including critical care time) Labs Review Labs Reviewed - No data to display  Imaging Review No  results found. I have personally reviewed and evaluated these images and lab results as part of my medical decision-making.   EKG Interpretation None      MDM  I have reviewed the relevant previous healthcare records. I obtained HPI from historian.  ED Course:  Assessment: Pt is a 24yF with hx Asthma who presents with asthma exacerbation x 2 days. Most likely due to allergies. On exam, pt in NAD. Nontoxic/nonseptic appearing. VSS. O2 sat 99% RA Afebrile. Lungs with bilateral wheeze. Heart RRR. Given duoneb treatment as well as continuous neb with improvement of symptoms. Advised patient that she could benefit from additional treatments such as IV Magnesium, but patient requested to return home with prednisone and inhaler. Pt O2 sat well on RA at 100%. Urged patient to follow up with PCP and establish care for treatments of her frequent Asthma attacks with use of her rescue inhaler. Given Prednisone in ED. Plan is to DC with Rx Prednisone x 5 days. Follow up to PCP. At time of discharge, Patient is in no acute distress. Vital Signs are stable. Patient is able to ambulate. Patient able to tolerate PO.    Disposition/Plan:  DC Home Additional Verbal discharge instructions given and discussed with patient.  Pt Instructed to f/u with PCP in the next week for evaluation and treatment of symptoms. Return precautions given Pt acknowledges and agrees with plan  Supervising Physician Arby Barrette, MD   Final diagnoses:  Asthma exacerbation       Audry Pili, PA-C 10/15/15 2220  Arby Barrette, MD 10/15/15 [phone]

## 2015-10-15 NOTE — ED Notes (Signed)
THE PT HAS ASTHMA AND FOR ONE WEEK SHE HAS HAD DIFFICULTY BREATHING.  SHE HAS AUDIBLE WHEEZES  NON-PRODUCTIVE COUGH

## 2015-10-15 NOTE — Discharge Instructions (Signed)
Please read and follow all provided instructions.  Your diagnoses today include:  1. Asthma exacerbation    Tests performed today include:  Vital signs. See below for your results today.   Medications prescribed:   Take any prescribed medications only as directed.  Home care instructions:  Follow any educational materials contained in this packet.  Follow-up instructions: Please follow-up with your primary care provider in the next 3 days for further evaluation of your symptoms and management of your asthma.  Return instructions:   Please return to the Emergency Department if you experience worsening symptoms.  Please return with worsening wheezing, shortness of breath, or difficulty breathing.  Return with persistent fever above 101F.   Please return if you have any other emergent concerns.  Additional Information:  Your vital signs today were: BP 126/90 mmHg   Pulse 96   Temp(Src) 98.3 F (36.8 C) (Oral)   Resp 18   Ht 5\' 6"  (1.676 m)   Wt 117.085 kg   BMI 41.68 kg/m2   SpO2 96% If your blood pressure (BP) was elevated above 135/85 this visit, please have this repeated by your doctor within one month. --------------

## 2015-10-15 NOTE — Progress Notes (Signed)
  CAT started.  

## 2016-09-28 ENCOUNTER — Encounter: Payer: Self-pay | Admitting: Obstetrics and Gynecology

## 2016-09-28 ENCOUNTER — Encounter (HOSPITAL_COMMUNITY): Payer: Self-pay | Admitting: *Deleted

## 2016-09-28 ENCOUNTER — Inpatient Hospital Stay (HOSPITAL_COMMUNITY)
Admission: AD | Admit: 2016-09-28 | Discharge: 2016-09-28 | Disposition: A | Discharge status: Home / Self Care | Payer: Medicaid Other | Source: Ambulatory Visit | Attending: Obstetrics and Gynecology | Admitting: Obstetrics and Gynecology

## 2016-09-28 DIAGNOSIS — R109 Unspecified abdominal pain: Secondary | ICD-10-CM | POA: Insufficient documentation

## 2016-09-28 DIAGNOSIS — Z3A15 15 weeks gestation of pregnancy: Secondary | ICD-10-CM | POA: Diagnosis not present

## 2016-09-28 DIAGNOSIS — M6208 Separation of muscle (nontraumatic), other site: Secondary | ICD-10-CM | POA: Diagnosis not present

## 2016-09-28 DIAGNOSIS — O26812 Pregnancy related exhaustion and fatigue, second trimester: Secondary | ICD-10-CM | POA: Diagnosis not present

## 2016-09-28 DIAGNOSIS — O26892 Other specified pregnancy related conditions, second trimester: Secondary | ICD-10-CM | POA: Diagnosis present

## 2016-09-28 DIAGNOSIS — Q7959 Other congenital malformations of abdominal wall: Secondary | ICD-10-CM | POA: Insufficient documentation

## 2016-09-28 DIAGNOSIS — R110 Nausea: Secondary | ICD-10-CM | POA: Diagnosis present

## 2016-09-28 DIAGNOSIS — O99512 Diseases of the respiratory system complicating pregnancy, second trimester: Secondary | ICD-10-CM | POA: Diagnosis not present

## 2016-09-28 DIAGNOSIS — R062 Wheezing: Secondary | ICD-10-CM | POA: Insufficient documentation

## 2016-09-28 DIAGNOSIS — O99332 Smoking (tobacco) complicating pregnancy, second trimester: Secondary | ICD-10-CM | POA: Diagnosis not present

## 2016-09-28 DIAGNOSIS — O219 Vomiting of pregnancy, unspecified: Secondary | ICD-10-CM | POA: Diagnosis not present

## 2016-09-28 DIAGNOSIS — F17210 Nicotine dependence, cigarettes, uncomplicated: Secondary | ICD-10-CM | POA: Insufficient documentation

## 2016-09-28 DIAGNOSIS — K439 Ventral hernia without obstruction or gangrene: Secondary | ICD-10-CM | POA: Insufficient documentation

## 2016-09-28 LAB — CBC
HCT: 36.8 % (ref 36.0–46.0)
Hemoglobin: 12.3 g/dL (ref 12.0–15.0)
MCH: 29.9 pg (ref 26.0–34.0)
MCHC: 33.4 g/dL (ref 30.0–36.0)
MCV: 89.5 fL (ref 78.0–100.0)
Platelets: 203 10*3/uL (ref 150–400)
RBC: 4.11 MIL/uL (ref 3.87–5.11)
RDW: 14.9 % (ref 11.5–15.5)
WBC: 8.7 10*3/uL (ref 4.0–10.5)

## 2016-09-28 LAB — URINALYSIS, ROUTINE W REFLEX MICROSCOPIC
Bilirubin Urine: NEGATIVE
Glucose, UA: NEGATIVE mg/dL
Ketones, ur: 15 mg/dL — AB
Leukocytes, UA: NEGATIVE
Nitrite: NEGATIVE
PROTEIN: NEGATIVE mg/dL
SPECIFIC GRAVITY, URINE: 1.025 (ref 1.005–1.030)
pH: 6 (ref 5.0–8.0)

## 2016-09-28 LAB — URINALYSIS, MICROSCOPIC (REFLEX)
Bacteria, UA: NONE SEEN
WBC, UA: NONE SEEN WBC/hpf (ref 0–5)

## 2016-09-28 LAB — POCT PREGNANCY, URINE: Preg Test, Ur: POSITIVE — AB

## 2016-09-28 NOTE — MAU Note (Signed)
Knows she is preg, hasn't seen anybody yet.  Has an umbilical hernia. . Every time she eats  It protrudes, and also with movement.  Is becoming more painful.  Is really tired, and a lot of nausea.  Had the hernia with first preg.

## 2016-09-28 NOTE — Discharge Instructions (Signed)
Morning Sickness Morning sickness is when you feel sick to your stomach (nauseous) during pregnancy. You may feel sick to your stomach and throw up (vomit). You may feel sick in the morning, but you can feel this way any time of day. Some women feel very sick to their stomach and cannot stop throwing up (hyperemesis gravidarum). Follow these instructions at home:  Only take medicines as told by your doctor.  Take multivitamins as told by your doctor. Taking multivitamins before getting pregnant can stop or lessen the harshness of morning sickness.  Eat dry toast or unsalted crackers before getting out of bed.  Eat 5 to 6 small meals a day.  Eat dry and bland foods like rice and baked potatoes.  Do not drink liquids with meals. Drink between meals.  Do not eat greasy, fatty, or spicy foods.  Have someone cook for you if the smell of food causes you to feel sick or throw up.  If you feel sick to your stomach after taking prenatal vitamins, take them at night or with a snack.  Eat protein when you need a snack (nuts, yogurt, cheese).  Eat unsweetened gelatins for dessert.  Wear a bracelet used for sea sickness (acupressure wristband).  Go to a doctor that puts thin needles into certain body points (acupuncture) to improve how you feel.  Do not smoke.  Use a humidifier to keep the air in your house free of odors.  Get lots of fresh air. Contact a doctor if:  You need medicine to feel better.  You feel dizzy or lightheaded.  You are losing weight. Get help right away if:  You feel very sick to your stomach and cannot stop throwing up.  You pass out (faint). This information is not intended to replace advice given to you by your health care provider. Make sure you discuss any questions you have with your health care provider. Document Released: 07/09/2004 Document Revised: 11/07/2015 Document Reviewed: 11/16/2012 Elsevier Interactive Patient Education  2017 Elsevier  Inc. Hernia A hernia happens when an organ or tissue inside your body pushes out through a weak spot in the belly (abdomen). Follow these instructions at home:  Avoid stretching or overusing (straining) the muscles near the hernia.  Do not lift anything heavier than 10 lb (4.5 kg).  Use the muscles in your leg when you lift something up. Do not use the muscles in your back.  When you cough, try to cough gently.  Eat a diet that has a lot of fiber. Eat lots of fruits and vegetables.  Drink enough fluids to keep your pee (urine) clear or pale yellow. Try to drink 6-8 glasses of water a day.  Take medicines to make your poop soft (stool softeners) as told by your doctor.  Lose weight, if you are overweight.  Do not use any tobacco products, including cigarettes, chewing tobacco, or electronic cigarettes. If you need help quitting, ask your doctor.  Keep all follow-up visits as told by your doctor. This is important. Contact a doctor if:  The skin by the hernia gets puffy (swollen) or red.  The hernia is painful. Get help right away if:  You have a fever.  You have belly pain that is getting worse.  You feel sick to your stomach (nauseous) or you throw up (vomit).  You cannot push the hernia back in place by gently pressing on it while you are lying down.  The hernia:  Changes in shape or size.  Is  stuck outside your belly.  Changes color.  Feels hard or tender. This information is not intended to replace advice given to you by your health care provider. Make sure you discuss any questions you have with your health care provider. Document Released: 11/19/2009 Document Revised: 11/07/2015 Document Reviewed: 04/11/2014 Elsevier Interactive Patient Education  2017 [employer].

## 2016-09-28 NOTE — MAU Provider Note (Signed)
History     CSN: [id number]  Arrival date and time: 09/28/16 1458   First Provider Initiated Contact with Patient 09/28/16 1614      Chief Complaint  Patient presents with  . Abdominal Pain  . Nausea  . Possible Pregnancy   G2P1001  by sure LMP here with pain at her hernia site. Sx started shortly after her last birth but has worsened over the last 2 months. Pain is intermittent. She sees a bulge of the hernia at times and has to push it back in. Eating worsens the sx. She also c/o intermittent nausea. She is tolerating po food and fluids.    OB History    Gravida Para Term Preterm AB Living   0 0 1   SAB TAB Ectopic Multiple Live Births   0 0 0 0 1      Past Medical History:  Diagnosis Date  . Asthma   . Infection    UTI X1  . Infection 2011   HSV  . Infection    CHLAMYDIA  ?  Marland Kitchen Infection 2010   GONORHHEA  . Infection    TRICH    Past Surgical History:  Procedure Laterality Date  . NO PAST SURGERIES      Family History  Problem Relation Age of Onset  . Diabetes Maternal Grandmother   . Stroke Maternal Grandmother   . Pulmonary embolism Maternal Grandfather     Social History  Substance Use Topics  . Smoking status: Current Every Day Smoker    Packs/day: 0.30    Types: Cigarettes    Last attempt to quit: 11/28/2011  . Smokeless tobacco: Never Used  . Alcohol use No     Comment: OCC MIXED DRINK;  NONE SINCE + UPT    Allergies:  Allergies  Allergen Reactions  . Pollen Extract Other (See Comments)    Runny nose    Prescriptions Prior to Admission  Medication Sig Dispense Refill Last Dose  . albuterol (PROVENTIL HFA;VENTOLIN HFA) 108 (90 Base) MCG/ACT inhaler Inhale 1-2 puffs into the lungs every 6 (six) hours as needed for wheezing or shortness of breath. 1 Inhaler 2   . ibuprofen (ADVIL,MOTRIN) 200 MG tablet Take 3 tablets (600 mg total) by mouth every 6 (six) hours as needed for moderate pain. 30 tablet 0   . loratadine (CLARITIN)  10 MG tablet Take 10 mg by mouth daily.   11/01/2013 at Unknown time  . Phenylephrine-APAP-Guaifenesin (MUCINEX FAST-MAX COLD & SINUS) 10-650-400 MG/20ML LIQD Take 30 mLs by mouth once.   11/02/2013 at Unknown time  . predniSONE (DELTASONE) 20 MG tablet Take 3 tablets (60 mg total) by mouth daily. 15 tablet 0   . Prenatal Vit-Fe Fumarate-FA (PRENATAL MULTIVITAMIN) TABS tablet Take 1 tablet by mouth daily.   Past Week at Unknown time  . pseudoephedrine (SUDAFED) 30 MG tablet Take 30 mg by mouth every 4 (four) hours as needed for congestion.   11/02/2013 at Unknown time    Review of Systems  Constitutional: Positive for fatigue.  Gastrointestinal: Positive for abdominal pain and nausea.  Genitourinary: Negative for vaginal bleeding.   Physical Exam   Blood pressure 126/64, temperature 98.5 F (36.9 C), temperature source Oral, resp. rate 18, weight 120.1 kg (264 lb 12 oz), last menstrual period 06/15/2016, currently breastfeeding.  Physical Exam  Nursing note and vitals reviewed. Constitutional: She is oriented to person, place, and time. She appears well-developed and well-nourished. No distress.  HENT:  Head:  Normocephalic and atraumatic.  Neck: Normal range of motion.  Respiratory: Effort normal.  GI: Soft. She exhibits no distension and no mass. There is no tenderness. There is no rebound and no guarding. A hernia is present.    Musculoskeletal: Normal range of motion.  Neurological: She is alert and oriented to person, place, and time.  Skin: Skin is warm and dry.  Psychiatric: She has a normal mood and affect.   FHT: 149 bpm  Results for orders placed or performed during the hospital encounter of 09/28/16 (from the past 24 hour(s))  Urinalysis, Routine w reflex microscopic     Status: Abnormal   Collection Time: 09/28/16  3:30 PM  Result Value Ref Range   Color, Urine YELLOW YELLOW   APPearance CLEAR CLEAR   Specific Gravity, Urine 1.025 1.005 - 1.030   pH 6.0 5.0 - 8.0    Glucose, UA NEGATIVE NEGATIVE mg/dL   Hgb urine dipstick TRACE (A) NEGATIVE   Bilirubin Urine NEGATIVE NEGATIVE   Ketones, ur 15 (A) NEGATIVE mg/dL   Protein, ur NEGATIVE NEGATIVE mg/dL   Nitrite NEGATIVE NEGATIVE   Leukocytes, UA NEGATIVE NEGATIVE  Urinalysis, Microscopic (reflex)     Status: Abnormal   Collection Time: 09/28/16  3:30 PM  Result Value Ref Range   RBC / HPF 0-5 0 - 5 RBC/hpf   WBC, UA NONE SEEN 0 - 5 WBC/hpf   Bacteria, UA NONE SEEN NONE SEEN   Squamous Epithelial / LPF 0-5 (A) NONE SEEN  Pregnancy, urine POC     Status: Abnormal   Collection Time: 09/28/16  4:02 PM  Result Value Ref Range   Preg Test, Ur POSITIVE (A) NEGATIVE  CBC     Status: None   Collection Time: 09/28/16  4:35 PM  Result Value Ref Range   WBC 8.7 4.0 - 10.5 K/uL   RBC 4.11 3.87 - 5.11 MIL/uL   Hemoglobin 12.3 12.0 - 15.0 g/dL   HCT 40.9 81.1 - 91.4 %   MCV 89.5 78.0 - 100.0 fL   MCH 29.9 26.0 - 34.0 pg   MCHC 33.4 30.0 - 36.0 g/dL   RDW 78.2 95.6 - 21.3 %   Platelets 203 150 - 400 K/uL   MAU Course  Procedures  MDM Labs ordered and reviewed. No evidence of bowel within the diastasis. Recommend establish OB care and referral to general surgery for evaluation in the event her status changes as pregnancy progresses. Recommend Vit B6 for nausea and small frequent meals. Fatigue likely physiologic to pregnancy, no signs of anemia. Stable for discharge home.  Assessment and Plan   1. [redacted] weeks gestation of pregnancy   2. Diastasis of rectus abdominis   3. Fatigue during pregnancy in second trimester   4. Nausea and vomiting during pregnancy    Discharge home Follow up at [facility] to establish care Return for worsening sx Small frequent meals and snacks Vitamin B6 bid-tid Safe medications sheet provided  Allergies as of 09/28/2016      Reactions   Pollen Extract Other (See Comments)   Runny nose      Medication List    STOP taking these medications   ibuprofen 200 MG  tablet Commonly known as:  ADVIL,MOTRIN     TAKE these medications   prenatal multivitamin Tabs tablet Take 1 tablet by mouth daily.      Donette Larry, CNM 09/28/2016, 4:28 PM

## 2016-10-12 ENCOUNTER — Encounter: Payer: Medicaid Other | Admitting: Obstetrics & Gynecology

## 2016-10-23 ENCOUNTER — Encounter (HOSPITAL_COMMUNITY): Payer: Self-pay | Admitting: *Deleted

## 2016-10-23 ENCOUNTER — Other Ambulatory Visit: Payer: Self-pay | Admitting: Student

## 2016-10-23 ENCOUNTER — Inpatient Hospital Stay (HOSPITAL_COMMUNITY)
Admission: AD | Admit: 2016-10-23 | Discharge: 2016-10-23 | Disposition: A | Discharge status: Home / Self Care | Payer: Medicaid Other | Source: Ambulatory Visit | Attending: Family Medicine | Admitting: Family Medicine

## 2016-10-23 DIAGNOSIS — Z3A18 18 weeks gestation of pregnancy: Secondary | ICD-10-CM | POA: Diagnosis not present

## 2016-10-23 DIAGNOSIS — O99332 Smoking (tobacco) complicating pregnancy, second trimester: Secondary | ICD-10-CM | POA: Diagnosis not present

## 2016-10-23 DIAGNOSIS — J4521 Mild intermittent asthma with (acute) exacerbation: Secondary | ICD-10-CM | POA: Diagnosis not present

## 2016-10-23 DIAGNOSIS — O9989 Other specified diseases and conditions complicating pregnancy, childbirth and the puerperium: Secondary | ICD-10-CM

## 2016-10-23 DIAGNOSIS — R0602 Shortness of breath: Secondary | ICD-10-CM | POA: Diagnosis present

## 2016-10-23 DIAGNOSIS — F17210 Nicotine dependence, cigarettes, uncomplicated: Secondary | ICD-10-CM | POA: Diagnosis not present

## 2016-10-23 DIAGNOSIS — J0100 Acute maxillary sinusitis, unspecified: Secondary | ICD-10-CM

## 2016-10-23 DIAGNOSIS — O99512 Diseases of the respiratory system complicating pregnancy, second trimester: Secondary | ICD-10-CM | POA: Diagnosis not present

## 2016-10-23 MED ORDER — ALBUTEROL SULFATE (2.5 MG/3ML) 0.083% IN NEBU
2.5000 mg | INHALATION_SOLUTION | Freq: Once | RESPIRATORY_TRACT | Status: AC
  Administered 2016-10-23: 2.5 mg via RESPIRATORY_TRACT
  Filled 2016-10-23: qty 3

## 2016-10-23 MED ORDER — FLUTICASONE PROPIONATE 50 MCG/ACT NA SUSP: 2.0000 | Freq: Every day | NASAL | 2 refills | Status: DC

## 2016-10-23 MED ORDER — AMOXICILLIN-POT CLAVULANATE 875-125 MG PO TABS: 1.0000 | ORAL_TABLET | Freq: Two times a day (BID) | ORAL | 0 refills | Status: AC

## 2016-10-23 MED ORDER — ACETAMINOPHEN 325 MG PO TABS
650.0000 mg | ORAL_TABLET | Freq: Four times a day (QID) | ORAL | Status: DC | PRN
  Administered 2016-10-23: 650 mg via ORAL
  Filled 2016-10-23: qty 2

## 2016-10-23 NOTE — MAU Provider Note (Signed)
History   Patient Jeanne [name] is a 26 y.o. G2P1001 At 5971w4d Here with complaints of shortness of breath since this morning.   Patient says that she just quit smoking three weeks. She has also had a sinus infection for the past 2-3 weeks, and she has greenish/yellow nasal discharge daily. She tried her inhaler at home this morning but it didn't work.   Patient denies vaginal bleeding, discharge or leaking of fluid.  CSN: [id number]  Arrival date and time: 10/23/16 [id number]   First Provider Initiated Contact with Patient 10/23/16 1903      Chief Complaint  Patient presents with  . Shortness of Breath   Shortness of Breath  This is a new problem. The current episode started today. The problem occurs constantly. The problem has been unchanged. Associated symptoms include headaches, rhinorrhea and sputum production. Pertinent negatives include no abdominal pain, chest pain, claudication, ear pain, fever or leg pain. The symptoms are aggravated by pollens and smoke.   OB History    Gravida Para Term Preterm AB Living   2 1 1  0 0 1   SAB TAB Ectopic Multiple Live Births   0 0 0 0 1      Past Medical History:  Diagnosis Date  . Asthma   . Infection    UTI X1  . Infection 2011   HSV  . Infection    CHLAMYDIA  ?  Marland Kitchen. Infection 2010   GONORHHEA  . Infection    TRICH    Past Surgical History:  Procedure Laterality Date  . NO PAST SURGERIES      Family History  Problem Relation Age of Onset  . Diabetes Maternal Grandmother   . Stroke Maternal Grandmother   . Pulmonary embolism Maternal Grandfather     Social History  Substance Use Topics  . Smoking status: Current Every Day Smoker    Packs/day: 0.30    Types: Cigarettes    Last attempt to quit: 11/28/2011  . Smokeless tobacco: Never Used  . Alcohol use No     Comment: OCC MIXED DRINK;  NONE SINCE + UPT    Allergies:  Allergies  Allergen Reactions  . Pollen Extract Other (See Comments)    Runny nose     Prescriptions Prior to Admission  Medication Sig Dispense Refill Last Dose  . albuterol (PROVENTIL HFA;VENTOLIN HFA) 108 (90 Base) MCG/ACT inhaler Inhale 1 puff into the lungs every 6 (six) hours as needed for wheezing or shortness of breath.   10/22/2016 at Unknown time  . cetirizine (ZYRTEC) 10 MG tablet Take 10 mg by mouth daily.   Past Month at Unknown time  . Prenatal Vit-Fe Fumarate-FA (PRENATAL MULTIVITAMIN) TABS tablet Take 1 tablet by mouth daily.   10/23/2016 at Unknown time    Review of Systems  Constitutional: Negative for fever.  HENT: Positive for rhinorrhea. Negative for ear pain.   Respiratory: Positive for sputum production and shortness of breath.   Cardiovascular: Negative for chest pain and claudication.  Gastrointestinal: Negative for abdominal pain.  Genitourinary: Negative.   Musculoskeletal: Negative.   Neurological: Positive for headaches.   Physical Exam   Blood pressure 128/75, pulse 93, temperature 98.9 F (37.2 C), resp. rate (!) 22, height 5\' 6"  (1.676 m), weight 117.9 kg (260 lb), last menstrual period 06/15/2016, SpO2 100 %, currently breastfeeding.  Physical Exam  Constitutional: She is oriented to person, place, and time. She appears well-developed and well-nourished.  Respiratory: Effort normal. She has wheezes.  Inspiratory  wheezes; no tachypnea  GI: Soft.  Musculoskeletal: Normal range of motion.  Neurological: She is alert and oriented to person, place, and time.  Skin: Skin is warm and dry.  Psychiatric: She has a normal mood and affect.    MAU Course  Procedures  MDM FHR by doppler 140 -nebulizer treatment and tylenol for headache Patient feeling better after nebulizer treatment; desires discharge Assessment and Plan   1. Exacerbation of intermittent asthma, unspecified asthma severity   2. Acute non-recurrent maxillary sinusitis     2. Patient stable for discharge with recommendation to use zyrtec twice a day, continue her  inhaler PRN and begin Augmentin for sinusitis.  3. Safe medications in pregnancy list given 4. Reviewed to come back to the MAU (bleeding, leaking of fluid, increased shortness of breath)  Charlesetta Garibaldi Komal Stangelo CNM 10/23/2016, 7:41 PM

## 2016-10-23 NOTE — Discharge Instructions (Signed)
Asthma Attack Prevention, Adult Although you may not be able to control the fact that you have asthma, you can take actions to prevent episodes of asthma (asthma attacks). These actions include:  Creating a written plan for managing and treating your asthma attacks (asthma action plan).  Monitoring your asthma.  Avoiding things that can irritate your airways or make your asthma symptoms worse (asthma triggers).  Taking your medicines as directed.  Acting quickly if you have signs or symptoms of an asthma attack. What are some ways to prevent an asthma attack? Create a plan Work with your health care provider to create an asthma action plan. This plan should include:  A list of your asthma triggers and how to avoid them.  A list of symptoms that you experience during an asthma attack.  Information about when to take medicine and how much medicine to take.  Information to help you understand your peak flow measurements.  Contact information for your health care providers.  Daily actions that you can take to control asthma. Monitor your asthma   To monitor your asthma:  Use your peak flow meter every morning and every evening for 2-3 weeks. Record the results in a journal. A drop in your peak flow numbers on one or more days may mean that you are starting to have an asthma attack, even if you are not having symptoms.  When you have asthma symptoms, write them down in a journal. Avoid asthma triggers   Work with your health care provider to find out what your asthma triggers are. This can be done by:  Being tested for allergies.  Keeping a journal that notes when asthma attacks occur and what may have contributed to them.  Asking your health care provider whether other medical conditions make your asthma worse. Common asthma triggers include:  Dust.  Smoke. This includes campfire smoke and secondhand smoke from tobacco products.  Pet dander.  Trees, grasses or  pollens.  Very cold, dry, or humid air.  Mold.  Foods that contain high amounts of sulfites.  Strong smells.  Engine exhaust and air pollution.  Aerosol sprays and fumes from household cleaners.  Household pests and their droppings, including dust mites and cockroaches.  Certain medicines, including NSAIDs. Once you have determined your asthma triggers, take steps to avoid them. Depending on your triggers, you may be able to reduce the chance of an asthma attack by:  Keeping your home clean. Have someone dust and vacuum your home for you 1 or 2 times a week. If possible, have them use a high-efficiency particulate arrestance (HEPA) vacuum.  Washing your sheets weekly in hot water.  Using allergy-proof mattress covers and casings on your bed.  Keeping pets out of your home.  Taking care of mold and water problems in your home.  Avoiding areas where people smoke.  Avoiding using strong perfumes or odor sprays.  Avoid spending a lot of time outdoors when pollen counts are high and on very windy days.  Talking with your health care provider before stopping or starting any new medicines. Medicines Take over-the-counter and prescription medicines only as told by your health care provider. Many asthma attacks can be prevented by carefully following your medicine schedule. Taking your medicines correctly is especially important when you cannot avoid certain asthma triggers. Even if you are doing well, do not stop taking your medicine and do not take less medicine. Act quickly If an asthma attack happens, acting quickly can decrease how severe   it is and how long it lasts. Take these actions:  Pay attention to your symptoms. If you are coughing, wheezing, or having difficulty breathing, do not wait to see if your symptoms go away on their own. Follow your asthma action plan.  If you have followed your asthma action plan and your symptoms are not improving, call your health care  provider or seek immediate medical care at the nearest hospital. It is important to write down how often you need to use your fast-acting rescue inhaler. You can track how often you use an inhaler in your journal. If you are using your rescue inhaler more often, it may mean that your asthma is not under control. Adjusting your asthma treatment plan may help you to prevent future asthma attacks and help you to gain better control of your condition. How can I prevent an asthma attack when I exercise?   Exercise is a common asthma trigger. To prevent asthma attacks during exercise:  Follow advice from your health care provider about whether you should use your fast-acting inhaler before exercising. Many people with asthma experience exercise-induced bronchoconstriction (EIB). This condition often worsens during vigorous exercise in cold, humid, or dry environments. Usually, people with EIB can stay very active by using a fast-acting inhaler before exercising.  Avoid exercising outdoors in very cold or humid weather.  Avoid exercising outdoors when pollen counts are high.  Warm up and cool down when exercising.  Stop exercising right away if asthma symptoms start. Consider taking part in exercises that are less likely to cause asthma symptoms such as:  Indoor swimming.  Biking.  Walking.  Hiking.  Playing football. This information is not intended to replace advice given to you by your health care provider. Make sure you discuss any questions you have with your health care provider. Document Released: 05/20/2009 Document Revised: 01/31/2016 Document Reviewed: 11/16/2015 Elsevier Interactive Patient Education  2017 [employer]vier Inc.  

## 2016-10-23 NOTE — MAU Note (Signed)
Pt c/o shortness breath that started this morning. Pt has a history of asthma and pt used her albuterol inhaler but it hasn't helped. Pt states she has been coughing today. Pt c/o wheezing. Pt c/o feeling lightheaded. Pt states she has had yellow mucous for the last couple weeks.

## 2016-10-26 ENCOUNTER — Ambulatory Visit (INDEPENDENT_AMBULATORY_CARE_PROVIDER_SITE_OTHER): Payer: Medicaid Other | Admitting: Obstetrics & Gynecology

## 2016-10-26 ENCOUNTER — Other Ambulatory Visit (HOSPITAL_COMMUNITY)
Admission: RE | Admit: 2016-10-26 | Discharge: 2016-10-26 | Disposition: A | Discharge status: Home / Self Care | Payer: Medicaid Other | Source: Ambulatory Visit | Attending: Obstetrics & Gynecology | Admitting: Obstetrics & Gynecology

## 2016-10-26 ENCOUNTER — Encounter: Payer: Self-pay | Admitting: Obstetrics & Gynecology

## 2016-10-26 DIAGNOSIS — Z3482 Encounter for supervision of other normal pregnancy, second trimester: Secondary | ICD-10-CM

## 2016-10-26 DIAGNOSIS — Z3480 Encounter for supervision of other normal pregnancy, unspecified trimester: Secondary | ICD-10-CM | POA: Diagnosis present

## 2016-10-26 DIAGNOSIS — J45909 Unspecified asthma, uncomplicated: Secondary | ICD-10-CM | POA: Insufficient documentation

## 2016-10-26 DIAGNOSIS — A6009 Herpesviral infection of other urogenital tract: Secondary | ICD-10-CM

## 2016-10-26 DIAGNOSIS — O98319 Other infections with a predominantly sexual mode of transmission complicating pregnancy, unspecified trimester: Secondary | ICD-10-CM

## 2016-10-26 DIAGNOSIS — O99512 Diseases of the respiratory system complicating pregnancy, second trimester: Secondary | ICD-10-CM

## 2016-10-26 DIAGNOSIS — O99330 Smoking (tobacco) complicating pregnancy, unspecified trimester: Secondary | ICD-10-CM | POA: Insufficient documentation

## 2016-10-26 DIAGNOSIS — O99519 Diseases of the respiratory system complicating pregnancy, unspecified trimester: Secondary | ICD-10-CM

## 2016-10-26 DIAGNOSIS — O98312 Other infections with a predominantly sexual mode of transmission complicating pregnancy, second trimester: Secondary | ICD-10-CM

## 2016-10-26 DIAGNOSIS — O99332 Smoking (tobacco) complicating pregnancy, second trimester: Secondary | ICD-10-CM

## 2016-10-26 MED ORDER — BUDESONIDE 180 MCG/ACT IN AEPB: 2.0000 | INHALATION_SPRAY | Freq: Every day | RESPIRATORY_TRACT | 3 refills | Status: DC

## 2016-10-26 MED ORDER — FLUTICASONE PROPIONATE HFA 110 MCG/ACT IN AERO: 2.0000 | INHALATION_SPRAY | Freq: Two times a day (BID) | RESPIRATORY_TRACT | 12 refills | Status: DC

## 2016-10-26 MED FILL — FLOVENT HFA 110 MCG INHALER: 110 | 30 days supply | Qty: 12 | Fill #0

## 2016-10-26 MED FILL — PULMICORT 180 MCG FLEXHALER: 180 | 30 days supply | Qty: 1 | Fill #0

## 2016-10-26 NOTE — Addendum Note (Signed)
Addended by: Anell BarrHOWARD, JENNIFER L on: 10/26/2016 11:40 AM   Modules accepted: Orders

## 2016-10-26 NOTE — Progress Notes (Addendum)
  Subjective:    Jeanne [name] is being seen today for her first obstetrical visit.  LMP 06/15/2016.This is not a planned pregnancy but, desired . She is at 4745w0d gestation. Her obstetrical history is significant for smoker. Relationship with FOB: significant other, not living together. Patient does intend to breast feed. Pregnancy history fully reviewed.  Patient reports no complaints.  Review of Systems:   Review of Systems  Objective:     BP 129/90   Pulse (!) 109   Wt 261 lb (118.4 kg)   LMP 06/15/2016   BMI 42.13 kg/m  Physical Exam  Exam General Appearance:    Alert, cooperative, no distress, appears stated age  Head:    Normocephalic, without obvious abnormality, atraumatic  Eyes:    conjunctiva/corneas clear, EOM's intact, both eyes  Ears:    Normal external ear canals, both ears  Nose:   Nares normal, septum midline, mucosa normal, no drainage    or sinus tenderness  Throat:   Lips, mucosa, and tongue normal; teeth and gums normal  Neck:   Supple, symmetrical, trachea midline, no adenopathy;    thyroid:  no enlargement/tenderness/nodules  Back:     Symmetric, no curvature, ROM normal, no CVA tenderness  Lungs:     Clear to auscultation bilaterally, respirations unlabored  Chest Wall:    No tenderness or deformity   Heart:    Regular rate and rhythm, S1 and S2 normal, no murmur, rub   or gallop  Breast Exam:    No tenderness, masses, or nipple abnormality  Abdomen:     Soft, non-tender, bowel sounds active all four quadrants,    no masses, no organomegaly  Genitalia:    Normal female without lesion, discharge or tenderness; uterus at umbilicus FHR 140's     Extremities:   Extremities normal, atraumatic, no cyanosis or edema  Pulses:   2+ and symmetric all extremities  Skin:   Skin color, texture, turgor normal, no rashes or lesions      Assessment:    Pregnancy: G2P1001 Patient Active Problem List   Diagnosis Date Noted  . Supervision of other normal pregnancy,  antepartum 10/26/2016  . Asthma affecting pregnancy, antepartum 10/26/2016  . Tobacco smoking affecting pregnancy, antepartum 10/26/2016  . Genital herpes affecting pregnancy, antepartum 10/26/2016  . Supraumbilical hernia 09/28/2016  . Herpes 06/01/2012  . Cold (disease) 02/18/2012  . Hx sexually transmitted disease (STD) 02/11/2012     Plan:     Initial labs drawn. Prenatal vitamins. Problem list reviewed and updated. AFP3 discussed: requested. Role of ultrasound in pregnancy discussed; fetal survey: requested. Amniocentesis discussed: not indicated. Follow up in 4 weeks. 60% of 40 min visit spent on counseling and coordination of care.   Flovent 2 puffs bid    Willodean RosenthalCarolyn Harraway-Smith 10/26/2016

## 2016-10-26 NOTE — Patient Instructions (Signed)

## 2016-10-26 NOTE — Addendum Note (Signed)
Addended by: Anell BarrHOWARD, Andreina Outten L on: 10/26/2016 02:57 PM   Modules accepted: Orders

## 2016-10-26 NOTE — Addendum Note (Signed)
Addended by: Willodean RosenthalHARRAWAY-SMITH, Tregan Read on: 10/26/2016 11:37 AM   Modules accepted: Orders

## 2016-10-27 ENCOUNTER — Ambulatory Visit (INDEPENDENT_AMBULATORY_CARE_PROVIDER_SITE_OTHER): Payer: Medicaid Other | Admitting: Clinical

## 2016-10-27 DIAGNOSIS — F4323 Adjustment disorder with mixed anxiety and depressed mood: Secondary | ICD-10-CM | POA: Diagnosis not present

## 2016-10-27 NOTE — Patient Instructions (Addendum)
My Safety Plan:   Step 1: Warning signs (thoughts, images, mood, situation, behavior) that a crisis may be developing:  Thoughts of jumping off a bridge   Step 2: People and social settings that provide distraction:  Name and contact: Go to mother's house, Lawson FiscalLori, [phone] Name and contact: Porfirio MylarCarmen, [phone]   Step 5: Professionals or agencies I can contact:  Local urgent care services:      1. 9-1-1     2. [facility] (24/7 walk-in) [address], [place], [place]     3. [facility]: Intake- [phone]/ [phone]  Closest Urgent Care/Emergency Room Address: [facility], [address] [place], [phone] Suicide Prevention Lifeline Phone: [phone]  Step 6: Making the environment safe- Have friend/family member remove from home: not necessary * Weapons in the home * Medication in the home (including Tylenol)  Step 7: The one thing that is most important to me and worth living for is: Myself  Signature of Patient: _________________________________________________  Signature of Provider: ________________________________________________

## 2016-10-27 NOTE — BH Specialist Note (Addendum)
Integrated Behavioral Health Initial Visit  MRN: [id number] Name: Jeanne Rhinember T Scadden   Session Start time: 10:40 Session End time: 11:20 Total time: 40 minutes  Type of Service: Integrated Behavioral Health- Individual/Family Interpretor:No. Interpretor Name and Language: n/a   Warm Hand Off Completed.       SUBJECTIVE: Jeanne [name] is a 26 y.o. female accompanied by patient. Patient was referred by Dr Erin FullingHarraway-Smith for depression, anxiety. Patient reports the following symptoms/concerns: Pt primary symptoms are feeling anxious, sleep difficulty, feeling depressed, low energy, lack of concentration. Pt states that she feels overwhelmed with life stress; open to practical strategies to manage life stress. Duration of problem: Over one month; Severity of problem: moderate  OBJECTIVE: Mood: Anxious and Depressed and Affect: Tearful Risk of harm to self or others: Suicidal ideation No plan to harm self or others    LIFE CONTEXT: Family and Social: Lives with husband and 4yo daughter School/Work: Starting new job tomorrow  Self-Care: No known substances, sleep difficulty Life Changes: Current pregnancy, starting new job this week  GOALS ADDRESSED: Patient will reduce symptoms of: anxiety, depression and stress and increase knowledge and/or ability of: self-management skills and also: Increase healthy adjustment to current life circumstances   INTERVENTIONS: Solution-Focused Strategies and Psychoeducation and/or Health Education  Standardized Assessments completed: GAD-7 and PHQ 2&9 with C-SSRS  ASSESSMENT: Patient currently experiencing Adjustment disorder with mixed anxious and depressed mood. Patient may benefit from psychoeducation and brief therapeutic interventions regarding coping with symptoms of anxiety and depression.  PLAN: 1. Follow up with behavioral health clinician on : Two weeks 2. Behavioral recommendations:  -Follow contract for safety -Obtain notebook  today, to begin Worry Hour strategy by separating worries into what is, and is not, within realm of personal control -CALM relaxation breathing exercise daily, every morning -Consider one minute walk upon waking -Consider sleep app for improved sleep -Consider [facility] walk-in clinic, to establish care, as needed 3. Referral(s): [facility] (In Clinic) and [facility] ([facility])   Rae LipsJamie C Essynce Munsch, [place]  Depression screen [facility] 2[facility] 10/27/2016  Decreased Interest 1  Down, Depressed, Hopeless 2  PHQ - 2 Score 3  Altered sleeping 3  Tired, decreased energy 2  Change in appetite 1  Feeling bad or failure about yourself  1  Trouble concentrating 2  Moving slowly or fidgety/restless 0  Suicidal thoughts 1  PHQ-9 Score 13   GAD 7 : Generalized Anxiety Score 10/27/2016  Nervous, Anxious, on Edge 3  Control/stop worrying 1  Worry too much - different things 1  Trouble relaxing 2  Restless 0  Easily annoyed or irritable 1  Afraid - awful might happen 1  Total GAD 7 Score 9

## 2016-10-28 LAB — CYTOLOGY - PAP
CHLAMYDIA, DNA PROBE: NEGATIVE
Diagnosis: NEGATIVE
NEISSERIA GONORRHEA: NEGATIVE

## 2016-10-28 LAB — URINE CULTURE, OB REFLEX: Organism ID, Bacteria: NO GROWTH

## 2016-10-28 LAB — CULTURE, OB URINE

## 2016-11-04 LAB — OBSTETRIC PANEL, INCLUDING HIV
HEP B S AG: NEGATIVE
RPR Ser Ql: NONREACTIVE
Rubella Antibodies, IGG: 3.08 index (ref >0.99)

## 2016-11-04 LAB — AFP TETRA
DIA Mom Value: 1.82
DIA VALUE (EIA): 237.91 pg/mL
DSR (By Age)    1 IN: 993
DSR (SECOND TRIMESTER) 1 IN: 418
Gestational Age: 19 WEEKS
MSAFP MOM: 0.81
MSAFP: 31.6 ng/mL
MSHCG Mom: 1.75
MSHCG: 31138 m[IU]/mL
Maternal Age At EDD: 25.8 yr
Osb Risk: 10000
T18 (By Age): 1:3867 {titer}
Test Results:: NEGATIVE
UE3 MOM: 0.9
UE3 VALUE: 1.2 ng/mL
Weight: 261 [lb_av]

## 2016-11-05 ENCOUNTER — Ambulatory Visit (HOSPITAL_COMMUNITY)
Admission: RE | Admit: 2016-11-05 | Discharge: 2016-11-05 | Disposition: A | Discharge status: Home / Self Care | Payer: Medicaid Other | Source: Ambulatory Visit | Attending: Obstetrics & Gynecology | Admitting: Obstetrics & Gynecology

## 2016-11-05 ENCOUNTER — Telehealth: Payer: Self-pay

## 2016-11-05 ENCOUNTER — Other Ambulatory Visit: Payer: Self-pay | Admitting: Obstetrics & Gynecology

## 2016-11-05 DIAGNOSIS — Z363 Encounter for antenatal screening for malformations: Secondary | ICD-10-CM | POA: Diagnosis present

## 2016-11-05 DIAGNOSIS — O99332 Smoking (tobacco) complicating pregnancy, second trimester: Secondary | ICD-10-CM

## 2016-11-05 DIAGNOSIS — J45909 Unspecified asthma, uncomplicated: Secondary | ICD-10-CM

## 2016-11-05 DIAGNOSIS — B009 Herpesviral infection, unspecified: Secondary | ICD-10-CM

## 2016-11-05 DIAGNOSIS — O98512 Other viral diseases complicating pregnancy, second trimester: Secondary | ICD-10-CM | POA: Insufficient documentation

## 2016-11-05 DIAGNOSIS — O99212 Obesity complicating pregnancy, second trimester: Secondary | ICD-10-CM | POA: Diagnosis present

## 2016-11-05 DIAGNOSIS — Z3A20 20 weeks gestation of pregnancy: Secondary | ICD-10-CM | POA: Insufficient documentation

## 2016-11-05 DIAGNOSIS — O99519 Diseases of the respiratory system complicating pregnancy, unspecified trimester: Secondary | ICD-10-CM | POA: Diagnosis present

## 2016-11-05 DIAGNOSIS — Z3480 Encounter for supervision of other normal pregnancy, unspecified trimester: Secondary | ICD-10-CM

## 2016-11-05 NOTE — Telephone Encounter (Signed)
[facility] called stating patient has been trying to reach us with questions.  Called patient only listed number twice and left messages both time with our office phone number. Armandina StammerJennifer Melany Wiesman RN BSN

## 2016-11-06 ENCOUNTER — Other Ambulatory Visit: Payer: Medicaid Other | Admitting: *Deleted

## 2016-11-06 ENCOUNTER — Other Ambulatory Visit (HOSPITAL_COMMUNITY)
Admission: RE | Admit: 2016-11-06 | Discharge: 2016-11-06 | Disposition: A | Discharge status: Home / Self Care | Payer: Medicaid Other | Source: Ambulatory Visit | Attending: Family Medicine | Admitting: Family Medicine

## 2016-11-06 ENCOUNTER — Encounter: Payer: Self-pay | Admitting: Family Medicine

## 2016-11-06 ENCOUNTER — Encounter: Payer: Self-pay | Admitting: *Deleted

## 2016-11-06 VITALS — BP 113/73 | HR 87 | Resp 16 | Ht 66.0 in

## 2016-11-06 DIAGNOSIS — L298 Other pruritus: Secondary | ICD-10-CM | POA: Insufficient documentation

## 2016-11-06 DIAGNOSIS — N898 Other specified noninflammatory disorders of vagina: Secondary | ICD-10-CM

## 2016-11-06 NOTE — Progress Notes (Signed)
Pt here for a nurse visit for Aptima self-swab.  Her c/o's consist of vaginal itching but denies any white d/c or bleeding.  She is requesting to be checked for yeast.  Will notifiy patient with results.

## 2016-11-10 ENCOUNTER — Other Ambulatory Visit: Payer: Self-pay | Admitting: Family Medicine

## 2016-11-10 ENCOUNTER — Ambulatory Visit: Payer: Self-pay

## 2016-11-10 ENCOUNTER — Other Ambulatory Visit: Payer: Self-pay

## 2016-11-10 DIAGNOSIS — B379 Candidiasis, unspecified: Secondary | ICD-10-CM

## 2016-11-10 LAB — INHERITEST SOCIETY GUIDED

## 2016-11-10 LAB — HEMOGLOBIN A1C
Est. average glucose Bld gHb Est-mCnc: 114 mg/dL
Hgb A1c MFr Bld: 5.6 % (ref 4.8–5.6)

## 2016-11-10 LAB — CERVICOVAGINAL ANCILLARY ONLY
BACTERIAL VAGINITIS: NEGATIVE
Candida vaginitis: POSITIVE — AB
Trichomonas: NEGATIVE

## 2016-11-10 LAB — GLUCOSE, RANDOM: Glucose: 111 mg/dL — ABNORMAL HIGH (ref 65–99)

## 2016-11-10 MED ORDER — FLUCONAZOLE 150 MG PO TABS: 150.0000 mg | ORAL_TABLET | Freq: Once | ORAL | 3 refills | Status: DC

## 2016-11-10 MED ORDER — TERCONAZOLE 0.4 % VA CREA: 1.0000 | TOPICAL_CREAM | Freq: Every day | VAGINAL | 0 refills | Status: DC

## 2016-11-10 NOTE — Telephone Encounter (Signed)
Left message on pt's answering machine to call office during the hours of 8-5 tomorrow to get her results. Pt was positive for yeast so I sen tin Terazol vaginal cream.

## 2016-11-10 NOTE — BH Specialist Note (Deleted)
Integrated Behavioral Health Follow Up Visit  MRN: [id number] Name: Jeanne [name]   Session Start time: *** Session End time: *** Total time: {IBH Total WUJW:11914782}Time:21014050} Number of Integrated Behavioral Health Clinician visits: 2/10  Type of Service: Integrated Behavioral Health- Individual/Family Interpretor:No. Interpretor Name and Language: n/a   Warm Hand Off Completed.       SUBJECTIVE: Jeanne [name] is a 26 y.o. female accompanied by {Persons; PED relatives w/patient:19415}. Patient was referred by Dr Erin FullingHarraway-Smith for depression, anxiety. Patient reports the following symptoms/concerns: *** Duration of problem: ***; Severity of problem: {Mild/Moderate/Severe:20260}  OBJECTIVE: Mood: {BHH MOOD:22306} and Affect: {BHH AFFECT:22307} Risk of harm to self or others: {CHL AMB BH Suicide Current Mental Status:21022748}   LIFE CONTEXT: Family and Social: Lives with husband and 4yo daughter School/Work: New job started *** Self-Care: *** Life Changes: Current pregnancy, began new job ***  GOALS ADDRESSED: Patient will reduce symptoms of: {IBH Symptoms:21014056} and increase knowledge and/or ability of: {IBH Patient Tools:21014057} and also: {IBH Goals:21014053}  INTERVENTIONS: {IBH Interventions:21014054} Standardized Assessments completed: GAD-7 and PHQ 9  ASSESSMENT: Patient currently experiencing ***. Patient may benefit from ***.  PLAN: 1. Follow up with behavioral health clinician on : *** 2. Behavioral recommendations:  -Continue to follow contract for safety -Continue with Worry Hour strategy daily *** -Continue CALM relaxation breathing exercise daily *** -Walk*** -Sleep aoo*** -[facility]*** 3. Referral(s): {IBH Referrals:21014055}  Jamie C McMannes, LCSWA

## 2016-11-11 ENCOUNTER — Telehealth: Payer: Self-pay

## 2016-11-11 ENCOUNTER — Telehealth: Payer: Self-pay | Admitting: Clinical

## 2016-11-11 NOTE — Telephone Encounter (Signed)
Left HIPPA-compliant message to call back [facility]Jamie [facility] at [phone].

## 2016-11-11 NOTE — Telephone Encounter (Signed)
Spoke with pt and she is aware that she has a yeast infection and that medication has been sent to her pharmacy

## 2016-11-23 ENCOUNTER — Ambulatory Visit (INDEPENDENT_AMBULATORY_CARE_PROVIDER_SITE_OTHER): Payer: Medicaid Other | Admitting: Obstetrics & Gynecology

## 2016-11-23 VITALS — BP 118/63 | HR 85 | Wt 262.0 lb

## 2016-11-23 DIAGNOSIS — Z3480 Encounter for supervision of other normal pregnancy, unspecified trimester: Secondary | ICD-10-CM

## 2016-11-23 DIAGNOSIS — Z3482 Encounter for supervision of other normal pregnancy, second trimester: Secondary | ICD-10-CM

## 2016-11-23 DIAGNOSIS — F32A Depression, unspecified: Secondary | ICD-10-CM

## 2016-11-23 DIAGNOSIS — F329 Major depressive disorder, single episode, unspecified: Secondary | ICD-10-CM

## 2016-11-23 NOTE — Progress Notes (Signed)
   PRENATAL VISIT NOTE  Subjective:  Jeanne [name] is a 26 y.o. 32P1001 (daughter 434 yo) at 8363w0d being seen today for ongoing prenatal care.  She is currently monitored for the following issues for this low-risk pregnancy and has Hx sexually transmitted disease (STD); Herpes; Supraumbilical hernia; Supervision of other normal pregnancy, antepartum; Asthma affecting pregnancy, antepartum; Tobacco smoking affecting pregnancy, antepartum; and Genital herpes affecting pregnancy, antepartum on her problem list.  Patient reports no complaints. She had many concerns about her anatomy u/s. Says "I'm no expert on u/s, but I saw some "mucous" on the placenta", worried about the bones of the baby's skull, worried if the eyes were normal. I tried to reassure her that the sonographer and radiologist saw only normal findings.  Contractions: Not present. Vag. Bleeding: None.  Movement: Present. Denies leaking of fluid.   The following portions of the patient's history were reviewed and updated as appropriate: allergies, current medications, past family history, past medical history, past social history, past surgical history and problem list. Problem list updated.  Objective:   Vitals:   11/23/16 1011  BP: 118/63  Pulse: 85  Weight: 262 lb (118.8 kg)    Fetal Status: Fetal Heart Rate (bpm): 145   Movement: Present     General:  Alert, oriented and cooperative. Patient is in no acute distress.  Skin: Skin is warm and dry. No rash noted.   Cardiovascular: Normal heart rate noted  Respiratory: Normal respiratory effort, no problems with respiration noted  Abdomen: Soft, gravid, appropriate for gestational age. Pain/Pressure: Absent     Pelvic:  Cervical exam deferred        Extremities: Normal range of motion.  Edema: None  Mental Status: Normal mood and affect. Normal behavior. Normal judgment and thought content.   Assessment and Plan:  Pregnancy: G2P1001 at 2963w0d  1. Supervision of other normal  pregnancy, antepartum sugar test at next visit [facility]  Preterm labor symptoms and general obstetric precautions including but not limited to vaginal bleeding, contractions, leaking of fluid and fetal movement were reviewed in detail with the patient. Please refer to After Visit Summary for other counseling recommendations.  No Follow-up on file.   Allie BossierMyra C Kazim Corrales, MD

## 2016-11-25 ENCOUNTER — Ambulatory Visit: Payer: Self-pay

## 2016-11-25 NOTE — BH Specialist Note (Deleted)
Integrated Behavioral Health Follow Up Visit  MRN: [id number] Name: Jeanne [name]   Session Start time: *** Session End time: *** Total time: {IBH Total WUJW:11914782}Time:21014050} Number of Integrated Behavioral Health Clinician visits: 2/10  Type of Service: Integrated Behavioral Health- Individual/Family Interpretor:No. Interpretor Name and Language: n/a   Warm Hand Off Completed.       SUBJECTIVE: Jeanne [name] is a 26 y.o. female accompanied by {Persons; PED relatives w/patient:19415}. Patient was referred by f/u Dr. Erin FullingHarraway-Smith for depression and anxiety. Patient reports the following symptoms/concerns: *** Duration of problem: ***; Severity of problem: {Mild/Moderate/Severe:20260}  OBJECTIVE: Mood: {BHH MOOD:22306} and Affect: {BHH AFFECT:22307} Risk of harm to self or others: {CHL AMB BH Suicide Current Mental Status:21022748}   LIFE CONTEXT: Family and Social: *** School/Work: *** Self-Care: *** Life Changes: ***  GOALS ADDRESSED: Patient will reduce symptoms of: {IBH Symptoms:21014056} and increase knowledge and/or ability of: {IBH Patient Tools:21014057} and also: {IBH Goals:21014053}  INTERVENTIONS: {IBH Interventions:21014054} Standardized Assessments completed: {IBH Screening Tools:21014051}  ASSESSMENT: Patient currently experiencing ***. Patient may benefit from ***.  PLAN: 1. Follow up with behavioral health clinician on : *** 2. Behavioral recommendations: *** 3. Referral(s): {IBH Referrals:21014055} 4. "From scale of 1-10, how likely are you to follow plan?": ***  Teneshia Hedeen C Arisha Gervais, LCSWA

## 2016-12-21 ENCOUNTER — Encounter: Payer: Self-pay | Admitting: Obstetrics & Gynecology

## 2017-01-14 ENCOUNTER — Other Ambulatory Visit: Payer: Self-pay | Admitting: Family Medicine

## 2017-01-14 ENCOUNTER — Ambulatory Visit (INDEPENDENT_AMBULATORY_CARE_PROVIDER_SITE_OTHER): Payer: Medicaid Other | Admitting: Family Medicine

## 2017-01-14 VITALS — BP 115/73 | HR 98 | Wt 270.0 lb

## 2017-01-14 DIAGNOSIS — Z3490 Encounter for supervision of normal pregnancy, unspecified, unspecified trimester: Secondary | ICD-10-CM

## 2017-01-14 DIAGNOSIS — Z3483 Encounter for supervision of other normal pregnancy, third trimester: Secondary | ICD-10-CM | POA: Diagnosis not present

## 2017-01-14 NOTE — Progress Notes (Addendum)
   PRENATAL VISIT NOTE  Subjective:  Jeanne [name] is a 26 y.o. G2P1001 at 5156w3d being seen today for ongoing prenatal care.  She is currently monitored for the following issues for this low-risk pregnancy and has Hx sexually transmitted disease (STD); Herpes; Supraumbilical hernia; Supervision of other normal pregnancy, antepartum; Asthma affecting pregnancy, antepartum; Tobacco smoking affecting pregnancy, antepartum; and Genital herpes affecting pregnancy, antepartum on her problem list.  Patient reports brown discharge. No odor, no bleeding, no itching..  Contractions: Not present. Vag. Bleeding: None.  Movement: Present. Denies leaking of fluid.   The following portions of the patient's history were reviewed and updated as appropriate: allergies, current medications, past family history, past medical history, past social history, past surgical history and problem list. Problem list updated.  Objective:   Vitals:   01/14/17 0828  BP: 115/73  Pulse: 98  Weight: 270 lb (122.5 kg)    Fetal Status: Fetal Heart Rate (bpm): 130 Fundal Height: 30 cm Movement: Present     General:  Alert, oriented and cooperative. Patient is in no acute distress.  Skin: Skin is warm and dry. No rash noted.   Cardiovascular: Normal heart rate noted  Respiratory: Normal respiratory effort, no problems with respiration noted  Abdomen: Soft, gravid, appropriate for gestational age.  Pain/Pressure: Present     Pelvic: Cervical exam deferred        Extremities: Normal range of motion.  Edema: None  Mental Status:  Normal mood and affect. Normal behavior. Normal judgment and thought content.   Assessment and Plan:  Pregnancy: G2P1001 at 8156w3d  1. Prenatal care, antepartum No concerns. FHT and FH normal. Declines Tdap. Declines vaginal culture. - Glucose Tolerance, 2 Hours w/1 Hour - RPR - Obstetric Panel, Including HIV  Preterm labor symptoms and general obstetric precautions including but not limited to  vaginal bleeding, contractions, leaking of fluid and fetal movement were reviewed in detail with the patient. Please refer to After Visit Summary for other counseling recommendations.  Return in about 2 weeks (around 01/28/2017).   Levie HeritageJacob J Chayim Bialas, DO

## 2017-01-15 LAB — OBSTETRIC PANEL, INCLUDING HIV
ANTIBODY SCREEN: NEGATIVE
Basophils Absolute: 0 10*3/uL (ref 0.0–0.2)
Basos: 0 %
EOS (ABSOLUTE): 0.3 10*3/uL (ref 0.0–0.4)
EOS: 4 %
HEMOGLOBIN: 12.1 g/dL (ref 11.1–15.9)
HEP B S AG: NEGATIVE
HIV SCREEN 4TH GENERATION: NONREACTIVE
Hematocrit: 36.9 % (ref 34.0–46.6)
Immature Grans (Abs): 0 10*3/uL (ref 0.0–0.1)
Immature Granulocytes: 1 %
LYMPHS ABS: 2.5 10*3/uL (ref 0.7–3.1)
Lymphs: 33 %
MCH: 30.1 pg (ref 26.6–33.0)
MCHC: 32.8 g/dL (ref 31.5–35.7)
MCV: 92 fL (ref 79–97)
MONOS ABS: 0.5 10*3/uL (ref 0.1–0.9)
Monocytes: 7 %
NEUTROS ABS: 4.2 10*3/uL (ref 1.4–7.0)
Neutrophils: 55 %
Platelets: 207 10*3/uL (ref 150–379)
RBC: 4.02 x10E6/uL (ref 3.77–5.28)
RDW: 15.3 % (ref 12.3–15.4)
RPR: NONREACTIVE
RUBELLA: 3.02 {index} (ref >0.99)
Rh Factor: POSITIVE
WBC: 7.5 10*3/uL (ref 3.4–10.8)

## 2017-01-15 LAB — GLUCOSE TOLERANCE, 2 HOURS W/ 1HR
GLUCOSE, FASTING: 85 mg/dL (ref 65–91)
Glucose, 1 hour: 146 mg/dL (ref 65–179)
Glucose, 2 hour: 79 mg/dL (ref 65–152)

## 2017-01-28 ENCOUNTER — Encounter: Payer: Self-pay | Admitting: Family Medicine

## 2017-02-22 ENCOUNTER — Ambulatory Visit: Payer: Self-pay

## 2017-02-23 ENCOUNTER — Ambulatory Visit (INDEPENDENT_AMBULATORY_CARE_PROVIDER_SITE_OTHER): Payer: Medicaid Other | Admitting: Advanced Practice Midwife

## 2017-02-23 ENCOUNTER — Encounter: Payer: Self-pay | Admitting: Advanced Practice Midwife

## 2017-02-23 ENCOUNTER — Other Ambulatory Visit (HOSPITAL_COMMUNITY)
Admission: RE | Admit: 2017-02-23 | Discharge: 2017-02-23 | Disposition: A | Discharge status: Home / Self Care | Payer: Medicaid Other | Source: Ambulatory Visit | Attending: Advanced Practice Midwife | Admitting: Advanced Practice Midwife

## 2017-02-23 VITALS — BP 126/68 | HR 78 | Wt 276.0 lb

## 2017-02-23 DIAGNOSIS — Z3493 Encounter for supervision of normal pregnancy, unspecified, third trimester: Secondary | ICD-10-CM

## 2017-02-23 DIAGNOSIS — Z3490 Encounter for supervision of normal pregnancy, unspecified, unspecified trimester: Secondary | ICD-10-CM | POA: Diagnosis not present

## 2017-02-23 DIAGNOSIS — Z3480 Encounter for supervision of other normal pregnancy, unspecified trimester: Secondary | ICD-10-CM

## 2017-02-23 NOTE — Patient Instructions (Signed)
Third Trimester of Pregnancy The third trimester is from week 28 through week 40 (months 7 through 9). The third trimester is a time when the unborn baby (fetus) is growing rapidly. At the end of the ninth month, the fetus is about 20 inches in length and weighs 6-10 pounds. Body changes during your third trimester Your body will continue to go through many changes during pregnancy. The changes vary from woman to woman. During the third trimester:  Your weight will continue to increase. You can expect to gain 25-35 pounds (11-16 kg) by the end of the pregnancy.  You may begin to get stretch marks on your hips, abdomen, and breasts.  You may urinate more often because the fetus is moving lower into your pelvis and pressing on your bladder.  You may develop or continue to have heartburn. This is caused by increased hormones that slow down muscles in the digestive tract.  You may develop or continue to have constipation because increased hormones slow digestion and cause the muscles that push waste through your intestines to relax.  You may develop hemorrhoids. These are swollen veins (varicose veins) in the rectum that can itch or be painful.  You may develop swollen, bulging veins (varicose veins) in your legs.  You may have increased body aches in the pelvis, back, or thighs. This is due to weight gain and increased hormones that are relaxing your joints.  You may have changes in your hair. These can include thickening of your hair, rapid growth, and changes in texture. Some women also have hair loss during or after pregnancy, or hair that feels dry or thin. Your hair will most likely return to normal after your baby is born.  Your breasts will continue to grow and they will continue to become tender. A yellow fluid (colostrum) may leak from your breasts. This is the first milk you are producing for your baby.  Your belly button may stick out.  You may notice more swelling in your hands,  face, or ankles.  You may have increased tingling or numbness in your hands, arms, and legs. The skin on your belly may also feel numb.  You may feel short of breath because of your expanding uterus.  You may have more problems sleeping. This can be caused by the size of your belly, increased need to urinate, and an increase in your body's metabolism.  You may notice the fetus "dropping," or moving lower in your abdomen (lightening).  You may have increased vaginal discharge.  You may notice your joints feel loose and you may have pain around your pelvic bone.  What to expect at prenatal visits You will have prenatal exams every 2 weeks until week 36. Then you will have weekly prenatal exams. During a routine prenatal visit:  You will be weighed to make sure you and the baby are growing normally.  Your blood pressure will be taken.  Your abdomen will be measured to track your baby's growth.  The fetal heartbeat will be listened to.  Any test results from the previous visit will be discussed.  You may have a cervical check near your due date to see if your cervix has softened or thinned (effaced).  You will be tested for Group B streptococcus. This happens between 35 and 37 weeks.  Your health care provider may ask you:  What your birth plan is.  How you are feeling.  If you are feeling the baby move.  If you have had   any abnormal symptoms, such as leaking fluid, bleeding, severe headaches, or abdominal cramping.  If you are using any tobacco products, including cigarettes, chewing tobacco, and electronic cigarettes.  If you have any questions.  Other tests or screenings that may be performed during your third trimester include:  Blood tests that check for low iron levels (anemia).  Fetal testing to check the health, activity level, and growth of the fetus. Testing is done if you have certain medical conditions or if there are problems during the  pregnancy.  Nonstress test (NST). This test checks the health of your baby to make sure there are no signs of problems, such as the baby not getting enough oxygen. During this test, a belt is placed around your belly. The baby is made to move, and its heart rate is monitored during movement.  What is false labor? False labor is a condition in which you feel small, irregular tightenings of the muscles in the womb (contractions) that usually go away with rest, changing position, or drinking water. These are called Braxton Hicks contractions. Contractions may last for hours, days, or even weeks before true labor sets in. If contractions come at regular intervals, become more frequent, increase in intensity, or become painful, you should see your health care provider. What are the signs of labor?  Abdominal cramps.  Regular contractions that start at 10 minutes apart and become stronger and more frequent with time.  Contractions that start on the top of the uterus and spread down to the lower abdomen and back.  Increased pelvic pressure and dull back pain.  A watery or bloody mucus discharge that comes from the vagina.  Leaking of amniotic fluid. This is also known as your "water breaking." It could be a slow trickle or a gush. Let your health care provider know if it has a color or strange odor. If you have any of these signs, call your health care provider right away, even if it is before your due date. Follow these instructions at home: Medicines  Follow your health care provider's instructions regarding medicine use. Specific medicines may be either safe or unsafe to take during pregnancy.  Take a prenatal vitamin that contains at least 600 micrograms (mcg) of folic acid.  If you develop constipation, try taking a stool softener if your health care provider approves. Eating and drinking  Eat a balanced diet that includes fresh fruits and vegetables, whole grains, good sources of protein  such as meat, eggs, or tofu, and low-fat dairy. Your health care provider will help you determine the amount of weight gain that is right for you.  Avoid raw meat and uncooked cheese. These carry germs that can cause birth defects in the baby.  If you have low calcium intake from food, talk to your health care provider about whether you should take a daily calcium supplement.  Eat four or five small meals rather than three large meals a day.  Limit foods that are high in fat and processed sugars, such as fried and sweet foods.  To prevent constipation: ? Drink enough fluid to keep your urine clear or pale yellow. ? Eat foods that are high in fiber, such as fresh fruits and vegetables, whole grains, and beans. Activity  Exercise only as directed by your health care provider. Most women can continue their usual exercise routine during pregnancy. Try to exercise for 30 minutes at least 5 days a week. Stop exercising if you experience uterine contractions.  Avoid heavy   lifting.  Do not exercise in extreme heat or humidity, or at high altitudes.  Wear low-heel, comfortable shoes.  Practice good posture.  You may continue to have sex unless your health care provider tells you otherwise. Relieving pain and discomfort  Take frequent breaks and rest with your legs elevated if you have leg cramps or low back pain.  Take warm sitz baths to soothe any pain or discomfort caused by hemorrhoids. Use hemorrhoid cream if your health care provider approves.  Wear a good support bra to prevent discomfort from breast tenderness.  If you develop varicose veins: ? Wear support pantyhose or compression stockings as told by your healthcare provider. ? Elevate your feet for 15 minutes, 3-4 times a day. Prenatal care  Write down your questions. Take them to your prenatal visits.  Keep all your prenatal visits as told by your health care provider. This is important. Safety  Wear your seat belt at  all times when driving.  Make a list of emergency phone numbers, including numbers for family, friends, the hospital, and police and fire departments. General instructions  Avoid cat litter boxes and soil used by cats. These carry germs that can cause birth defects in the baby. If you have a cat, ask someone to clean the litter box for you.  Do not travel far distances unless it is absolutely necessary and only with the approval of your health care provider.  Do not use hot tubs, steam rooms, or saunas.  Do not drink alcohol.  Do not use any products that contain nicotine or tobacco, such as cigarettes and e-cigarettes. If you need help quitting, ask your health care provider.  Do not use any medicinal herbs or unprescribed drugs. These chemicals affect the formation and growth of the baby.  Do not douche or use tampons or scented sanitary pads.  Do not cross your legs for long periods of time.  To prepare for the arrival of your baby: ? Take prenatal classes to understand, practice, and ask questions about labor and delivery. ? Make a trial run to the hospital. ? Visit the hospital and tour the maternity area. ? Arrange for maternity or paternity leave through employers. ? Arrange for family and friends to take care of pets while you are in the hospital. ? Purchase a rear-facing car seat and make sure you know how to install it in your car. ? Pack your hospital bag. ? Prepare the baby's nursery. Make sure to remove all pillows and stuffed animals from the baby's crib to prevent suffocation.  Visit your dentist if you have not gone during your pregnancy. Use a soft toothbrush to brush your teeth and be gentle when you floss. Contact a health care provider if:  You are unsure if you are in labor or if your water has broken.  You become dizzy.  You have mild pelvic cramps, pelvic pressure, or nagging pain in your abdominal area.  You have lower back pain.  You have persistent  nausea, vomiting, or diarrhea.  You have an unusual or bad smelling vaginal discharge.  You have pain when you urinate. Get help right away if:  Your water breaks before 37 weeks.  You have regular contractions less than 5 minutes apart before 37 weeks.  You have a fever.  You are leaking fluid from your vagina.  You have spotting or bleeding from your vagina.  You have severe abdominal pain or cramping.  You have rapid weight loss or weight gain.    You have shortness of breath with chest pain.  You notice sudden or extreme swelling of your face, hands, ankles, feet, or legs.  Your baby makes fewer than 10 movements in 2 hours.  You have severe headaches that do not go away when you take medicine.  You have vision changes. Summary  The third trimester is from week 28 through week 40, months 7 through 9. The third trimester is a time when the unborn baby (fetus) is growing rapidly.  During the third trimester, your discomfort may increase as you and your baby continue to gain weight. You may have abdominal, leg, and back pain, sleeping problems, and an increased need to urinate.  During the third trimester your breasts will keep growing and they will continue to become tender. A yellow fluid (colostrum) may leak from your breasts. This is the first milk you are producing for your baby.  False labor is a condition in which you feel small, irregular tightenings of the muscles in the womb (contractions) that eventually go away. These are called Braxton Hicks contractions. Contractions may last for hours, days, or even weeks before true labor sets in.  Signs of labor can include: abdominal cramps; regular contractions that start at 10 minutes apart and become stronger and more frequent with time; watery or bloody mucus discharge that comes from the vagina; increased pelvic pressure and dull back pain; and leaking of amniotic fluid. This information is not intended to replace advice  given to you by your health care provider. Make sure you discuss any questions you have with your health care provider. Document Released: 05/26/2001 Document Revised: 11/07/2015 Document Reviewed: 08/02/2012 Elsevier Interactive Patient Education  2017 [employer]  

## 2017-02-23 NOTE — Progress Notes (Signed)
   PRENATAL VISIT NOTE  Subjective:  Jeanne [name] is a 26 y.o. G2P1001 at 6131w1d being seen today for ongoing prenatal care.  She is currently monitored for the following issues for this high-risk pregnancy and has Hx sexually transmitted disease (STD); Herpes; Supraumbilical hernia; Supervision of other normal pregnancy, antepartum; Asthma affecting pregnancy, antepartum; Tobacco smoking affecting pregnancy, antepartum; and Genital herpes affecting pregnancy, antepartum on her problem list.  Patient reports occasional contractions.  Contractions: Not present. Vag. Bleeding: None.  Movement: Present. Denies leaking of fluid.  Has some tingling of right hand when first wakes up, then goes away  The following portions of the patient's history were reviewed and updated as appropriate: allergies, current medications, past family history, past medical history, past social history, past surgical history and problem list. Problem list updated.  Objective:   Vitals:   02/23/17 0856  BP: 126/68  Pulse: 78  Weight: 276 lb (125.2 kg)    Fetal Status:     Movement: Present     General:  Alert, oriented and cooperative. Patient is in no acute distress.  Skin: Skin is warm and dry. No rash noted.   Cardiovascular: Normal heart rate noted  Respiratory: Normal respiratory effort, no problems with respiration noted  Abdomen: Soft, gravid, appropriate for gestational age.  Pain/Pressure: Present     Pelvic: Cervical exam performed        Extremities: Normal range of motion.  Edema: None  Mental Status:  Normal mood and affect. Normal behavior. Normal judgment and thought content.   Assessment and Plan:  Pregnancy: G2P1001 at 4131w1d  1. Prenatal care, antepartum     Unsure of presentation due to high station     US done which confirmed vertex presentation  - Culture, beta strep (group b only) - GC[facility]Chlamydia [facility]  2. Supervision of other normal pregnancy,  antepartum      Completed "box" items      Long discussion of contraception, leaning toward LNG IUD  Term labor symptoms and general obstetric precautions including but not limited to vaginal bleeding, contractions, leaking of fluid and fetal movement were reviewed in detail with the patient. Please refer to After Visit Summary for other counseling recommendations.  Return in about 1 week (around 03/02/2017) [facility].   Wynelle BourgeoisMarie Renne Cornick, CNM

## 2017-02-24 LAB — GC/CHLAMYDIA PROBE AMP (~~LOC~~) NOT AT ARMC
Chlamydia: NEGATIVE
Neisseria Gonorrhea: NEGATIVE

## 2017-02-27 LAB — CULTURE, BETA STREP (GROUP B ONLY): Strep Gp B Culture: POSITIVE — AB

## 2017-03-01 ENCOUNTER — Encounter: Payer: Self-pay | Admitting: Family Medicine

## 2017-03-02 ENCOUNTER — Ambulatory Visit (INDEPENDENT_AMBULATORY_CARE_PROVIDER_SITE_OTHER): Payer: Medicaid Other | Admitting: Obstetrics & Gynecology

## 2017-03-02 VITALS — BP 123/69 | HR 86 | Wt 272.0 lb

## 2017-03-02 DIAGNOSIS — O98319 Other infections with a predominantly sexual mode of transmission complicating pregnancy, unspecified trimester: Secondary | ICD-10-CM

## 2017-03-02 DIAGNOSIS — O99519 Diseases of the respiratory system complicating pregnancy, unspecified trimester: Principal | ICD-10-CM

## 2017-03-02 DIAGNOSIS — O99513 Diseases of the respiratory system complicating pregnancy, third trimester: Secondary | ICD-10-CM

## 2017-03-02 DIAGNOSIS — O98313 Other infections with a predominantly sexual mode of transmission complicating pregnancy, third trimester: Secondary | ICD-10-CM

## 2017-03-02 DIAGNOSIS — J45909 Unspecified asthma, uncomplicated: Secondary | ICD-10-CM

## 2017-03-02 DIAGNOSIS — Z3480 Encounter for supervision of other normal pregnancy, unspecified trimester: Secondary | ICD-10-CM

## 2017-03-02 DIAGNOSIS — A6009 Herpesviral infection of other urogenital tract: Secondary | ICD-10-CM

## 2017-03-02 MED ORDER — VALACYCLOVIR HCL 1 G PO TABS: 1000.0000 mg | ORAL_TABLET | Freq: Every day | ORAL | 0 refills | Status: AC

## 2017-03-02 MED ORDER — VALACYCLOVIR HCL 1 G PO TABS: 1000.0000 mg | ORAL_TABLET | Freq: Every day | ORAL | 0 refills | Status: DC

## 2017-03-02 NOTE — Progress Notes (Signed)
   PRENATAL VISIT NOTE  Subjective:  Jeanne [name] is a 26 y.o. G2P1001 at [redacted]w[redacted]d being seen today for ongoing prenatal care.  She is currently monitored for the following issues for this low-risk pregnancy and has Hx sexually transmitted disease (STD); Herpes; Supraumbilical hernia; Supervision of other normal pregnancy, antepartum; Asthma affecting pregnancy, antepartum; Tobacco smoking affecting pregnancy, antepartum; and Genital herpes affecting pregnancy, antepartum on her problem list.  Patient reports no complaints.  Contractions: Irritability. Vag. Bleeding: None.  Movement: Present. Denies leaking of fluid.   The following portions of the patient's history were reviewed and updated as appropriate: allergies, current medications, past family history, past medical history, past social history, past surgical history and problem list. Problem list updated.  Objective:   Vitals:   03/02/17 0845  BP: 123/69  Pulse: 86  Weight: 272 lb (123.4 kg)    Fetal Status: Fetal Heart Rate (bpm): 130   Movement: Present     General:  Alert, oriented and cooperative. Patient is in no acute distress.  Skin: Skin is warm and dry. No rash noted.   Cardiovascular: Normal heart rate noted  Respiratory: Normal respiratory effort, no problems with respiration noted  Abdomen: Soft, gravid, appropriate for gestational age.  Pain/Pressure: Present     Pelvic: Cervical exam deferred        Extremities: Normal range of motion.  Edema: Trace  Mental Status:  Normal mood and affect. Normal behavior. Normal judgment and thought content.   Assessment and Plan:  Pregnancy: G2P1001 at [redacted]w[redacted]d  1. Asthma affecting pregnancy, antepartum   2. Genital herpes affecting pregnancy, antepartum - encouraged her to start her Valtrex asap, prescription sent today  3. Supervision of other normal pregnancy, antepartum   Term labor symptoms and general obstetric precautions including but not limited to vaginal  bleeding, contractions, leaking of fluid and fetal movement were reviewed in detail with the patient. Please refer to After Visit Summary for other counseling recommendations.  No Follow-up on file.   Allie Bossier, MD

## 2017-03-02 NOTE — Addendum Note (Signed)
Addended by: Anell Barr on: 03/02/2017 09:13 AM   Modules accepted: Orders

## 2017-03-09 ENCOUNTER — Encounter: Payer: Self-pay | Admitting: Advanced Practice Midwife

## 2017-03-16 ENCOUNTER — Telehealth: Payer: Self-pay

## 2017-03-16 NOTE — Telephone Encounter (Signed)
Patient called requesting [place] to schedule her induction so she could "save on gas money".  Explained to patient that she missed her last appointment and we will need to see her to have her scheduled for any kind of induction. I also explained that elective inductions are done at 41 weeks.  Patient scheduled to come into office this Friday 03-19-17. Armandina Stammer RNBSN

## 2017-03-19 ENCOUNTER — Ambulatory Visit (INDEPENDENT_AMBULATORY_CARE_PROVIDER_SITE_OTHER): Payer: Medicaid Other | Admitting: Family Medicine

## 2017-03-19 VITALS — BP 128/82 | HR 83 | Wt 277.0 lb

## 2017-03-19 DIAGNOSIS — O368130 Decreased fetal movements, third trimester, not applicable or unspecified: Secondary | ICD-10-CM

## 2017-03-19 DIAGNOSIS — A6009 Herpesviral infection of other urogenital tract: Secondary | ICD-10-CM

## 2017-03-19 DIAGNOSIS — Z3483 Encounter for supervision of other normal pregnancy, third trimester: Secondary | ICD-10-CM

## 2017-03-19 DIAGNOSIS — Z22330 Carrier of Group B streptococcus: Secondary | ICD-10-CM

## 2017-03-19 DIAGNOSIS — O98319 Other infections with a predominantly sexual mode of transmission complicating pregnancy, unspecified trimester: Secondary | ICD-10-CM

## 2017-03-19 DIAGNOSIS — Z3480 Encounter for supervision of other normal pregnancy, unspecified trimester: Secondary | ICD-10-CM

## 2017-03-19 DIAGNOSIS — O98313 Other infections with a predominantly sexual mode of transmission complicating pregnancy, third trimester: Secondary | ICD-10-CM | POA: Diagnosis not present

## 2017-03-19 NOTE — Progress Notes (Signed)
   PRENATAL VISIT NOTE  Subjective:  Jeanne [name] is a 26 y.o. G2P1001 at [redacted]w[redacted]d being seen today for ongoing prenatal care.  She is currently monitored for the following issues for this low-risk pregnancy and has Hx sexually transmitted disease (STD); Herpes; Supraumbilical hernia; Supervision of other normal pregnancy, antepartum; Asthma affecting pregnancy, antepartum; Tobacco smoking affecting pregnancy, antepartum; and Genital herpes affecting pregnancy, antepartum on her problem list.  Patient reports no complaints.  Contractions: Irritability. Vag. Bleeding: None.  Movement: Present. Denies leaking of fluid.   The following portions of the patient's history were reviewed and updated as appropriate: allergies, current medications, past family history, past medical history, past social history, past surgical history and problem list. Problem list updated.  Objective:   Vitals:   03/19/17 0906  BP: 128/82  Pulse: 83  Weight: 277 lb (125.6 kg)    Fetal Status: Fetal Heart Rate (bpm): 135   Movement: Present  Presentation: Vertex  General:  Alert, oriented and cooperative. Patient is in no acute distress.  Skin: Skin is warm and dry. No rash noted.   Cardiovascular: Normal heart rate noted  Respiratory: Normal respiratory effort, no problems with respiration noted  Abdomen: Soft, gravid, appropriate for gestational age.  Pain/Pressure: Present     Pelvic: Cervical exam performed Dilation: 1 Effacement (%): Thick Station: Ballotable  Extremities: Normal range of motion.  Edema: None  Mental Status:  Normal mood and affect. Normal behavior. Normal judgment and thought content.   Assessment and Plan:  Pregnancy: G2P1001 at [redacted]w[redacted]d  1. Supervision of other normal pregnancy, antepartum FHT normal. Vertex by [place]. NST done for decreased fetal movement. NST reactive  2. Genital herpes affecting pregnancy, antepartum Continue Valtrex.  Term labor symptoms and general obstetric  precautions including but not limited to vaginal bleeding, contractions, leaking of fluid and fetal movement were reviewed in detail with the patient. Please refer to After Visit Summary for other counseling recommendations.  No Follow-up on file.   Levie Heritage, DO

## 2017-03-19 NOTE — Progress Notes (Signed)
Pt states she didn't feel baby move yesterday so she was seen at a hospital in [place] last night at 11p.[facility] did an NST and told pt it was reactive and the baby was okay. Pt has felt baby move today.

## 2017-03-22 ENCOUNTER — Telehealth: Payer: Self-pay

## 2017-03-22 NOTE — Telephone Encounter (Signed)
Patient called stating that since last night (03-21-17) she has notice a small amount of fluid dripping when she changes positions. Patient states she doesn't think it is urine. Patient states that baby is moving well. Patient instructed to go directly to [facility] for evaluation. Patient states understanding and agrees. Armandina Stammer RNBSN

## 2017-03-24 ENCOUNTER — Encounter: Payer: Self-pay | Admitting: Family Medicine

## 2017-03-29 ENCOUNTER — Telehealth: Payer: Self-pay | Admitting: *Deleted

## 2017-03-29 NOTE — Telephone Encounter (Signed)
Pt called to office to schedule PP visit, has been scheduled. Pt had questions regarding pain and how she feels after delivery.  Pt states that she has been having hip pain.  Pt states that she does not want to take pain medication that she was given since she is breastfeeding.  Pt states that she also would like to have a BTL and signed paperwork. Pt made aware that she may take pain medication as prescribed, she may take Ibuprofen  every 6 hours. Pt states that she may not take these as recommended.  Extra strength Tylenol also suggested.  Pt also made aware that she may try heat pack to back as needed for discomfort. Pt made aware that she may discuss BTL at her PP visit. Pt advised to keep appt as scheduled. Pt advised to call office if pain worsens or no relief with comfort measures.  Pt states understanding.

## 2017-04-23 ENCOUNTER — Ambulatory Visit: Payer: Self-pay | Admitting: Family Medicine

## 2017-06-14 ENCOUNTER — Encounter (HOSPITAL_COMMUNITY): Payer: Self-pay

## 2020-05-15 ENCOUNTER — Other Ambulatory Visit: Payer: Self-pay

## 2020-05-15 ENCOUNTER — Emergency Department (HOSPITAL_COMMUNITY)
Admission: EM | Admit: 2020-05-15 | Discharge: 2020-05-15 | Disposition: A | Discharge status: Home / Self Care | Payer: Medicaid Other | Attending: Emergency Medicine | Admitting: Emergency Medicine

## 2020-05-15 ENCOUNTER — Encounter (HOSPITAL_COMMUNITY): Payer: Self-pay | Admitting: Emergency Medicine

## 2020-05-15 DIAGNOSIS — R1031 Right lower quadrant pain: Secondary | ICD-10-CM | POA: Insufficient documentation

## 2020-05-15 DIAGNOSIS — Z5321 Procedure and treatment not carried out due to patient leaving prior to being seen by health care provider: Secondary | ICD-10-CM | POA: Diagnosis not present

## 2020-05-15 LAB — CBC WITH DIFFERENTIAL/PLATELET
Abs Immature Granulocytes: 0.02 10*3/uL (ref 0.00–0.07)
Basophils Absolute: 0.1 10*3/uL (ref 0.0–0.1)
Basophils Relative: 1 %
Eosinophils Absolute: 0.3 10*3/uL (ref 0.0–0.5)
Eosinophils Relative: 4 %
HCT: 41.2 % (ref 36.0–46.0)
Hemoglobin: 12.8 g/dL (ref 12.0–15.0)
Immature Granulocytes: 0 %
Lymphocytes Relative: 46 %
Lymphs Abs: 3.4 10*3/uL (ref 0.7–4.0)
MCH: 28.4 pg (ref 26.0–34.0)
MCHC: 31.1 g/dL (ref 30.0–36.0)
MCV: 91.4 fL (ref 80.0–100.0)
Monocytes Absolute: 0.7 10*3/uL (ref 0.1–1.0)
Monocytes Relative: 9 %
Neutro Abs: 3 10*3/uL (ref 1.7–7.7)
Neutrophils Relative %: 40 %
Platelets: 307 10*3/uL (ref 150–400)
RBC: 4.51 MIL/uL (ref 3.87–5.11)
RDW: 14.3 % (ref 11.5–15.5)
WBC: 7.5 10*3/uL (ref 4.0–10.5)
nRBC: 0 % (ref 0.0–0.2)

## 2020-05-15 LAB — URINALYSIS, ROUTINE W REFLEX MICROSCOPIC
Bilirubin Urine: NEGATIVE
Glucose, UA: NEGATIVE mg/dL
Hgb urine dipstick: NEGATIVE
Ketones, ur: NEGATIVE mg/dL
Nitrite: NEGATIVE
Protein, ur: NEGATIVE mg/dL
Specific Gravity, Urine: 1.011 (ref 1.005–1.030)
pH: 6 (ref 5.0–8.0)

## 2020-05-15 LAB — COMPREHENSIVE METABOLIC PANEL
ALT: 20 U/L (ref 0–44)
AST: 17 U/L (ref 15–41)
Albumin: 3.6 g/dL (ref 3.5–5.0)
Alkaline Phosphatase: 54 U/L (ref 38–126)
Anion gap: 10 (ref 5–15)
BUN: 7 mg/dL (ref 6–20)
CO2: 27 mmol/L (ref 22–32)
Calcium: 9.3 mg/dL (ref 8.9–10.3)
Chloride: 101 mmol/L (ref 98–111)
Creatinine, Ser: 0.78 mg/dL (ref 0.44–1.00)
GFR, Estimated: >60 mL/min (ref >60)
Glucose, Bld: 82 mg/dL (ref 70–99)
Potassium: 3.8 mmol/L (ref 3.5–5.1)
Sodium: 138 mmol/L (ref 135–145)
Total Bilirubin: 0.4 mg/dL (ref 0.3–1.2)
Total Protein: 7.1 g/dL (ref 6.5–8.1)

## 2020-05-15 LAB — LIPASE, BLOOD: Lipase: 29 U/L (ref 11–51)

## 2020-05-15 LAB — I-STAT BETA HCG BLOOD, ED (MC, WL, AP ONLY): I-stat hCG, quantitative: <5 m[IU]/mL (ref <5)

## 2020-05-15 NOTE — ED Triage Notes (Signed)
Pt here from home with c/o lower right abd pain , that started Monday night , no n/v

## 2020-05-15 NOTE — ED Notes (Signed)
Pt did not want to wait and left. 

## 2020-07-08 DIAGNOSIS — Z6841 Body Mass Index (BMI) 40.0 and over, adult: Secondary | ICD-10-CM | POA: Insufficient documentation

## 2021-01-29 ENCOUNTER — Ambulatory Visit: Payer: Medicaid Other | Admitting: Physician Assistant

## 2021-02-11 ENCOUNTER — Ambulatory Visit: Payer: Medicaid Other | Admitting: Physician Assistant
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
## 2021-02-20 ENCOUNTER — Ambulatory Visit (INDEPENDENT_AMBULATORY_CARE_PROVIDER_SITE_OTHER): Payer: Medicaid Other | Admitting: Physician Assistant

## 2021-02-20 ENCOUNTER — Other Ambulatory Visit: Payer: Self-pay

## 2021-02-20 ENCOUNTER — Encounter: Payer: Self-pay | Admitting: Physician Assistant

## 2021-02-20 VITALS — BP 129/83 | HR 93 | Resp 20 | Ht 66.0 in | Wt 270.0 lb

## 2021-02-20 DIAGNOSIS — R404 Transient alteration of awareness: Secondary | ICD-10-CM | POA: Diagnosis not present

## 2021-02-20 DIAGNOSIS — R569 Unspecified convulsions: Secondary | ICD-10-CM

## 2021-02-20 NOTE — Patient Instructions (Addendum)
It was a pleasure to see you today at our office.   Recommendations:  EEG  No driving till the EEG confirms you don't have a seizure  Continue with Psychiatrist  Consider Cognitive Behavioral Therapy   RECOMMENDATIONS FOR ALL PATIENTS WITH MEMORY PROBLEMS: 1. Continue to exercise (Recommend 30 minutes of walking everyday, or 3 hours every week) 2. Increase social interactions - continue going to [place] and enjoy social gatherings with friends and family 3. Eat healthy, avoid fried foods and eat more fruits and vegetables 4. Maintain adequate blood pressure, blood sugar, and blood cholesterol level. Reducing the risk of stroke and cardiovascular disease also helps promoting better memory. 5. Avoid stressful situations. Live a simple life and avoid aggravations. Organize your time and prepare for the next day in anticipation. 6. Sleep well, avoid any interruptions of sleep and avoid any distractions in the bedroom that may interfere with adequate sleep quality 7. Avoid sugar, avoid sweets as there is a strong link between excessive sugar intake, diabetes, and cognitive impairment We discussed the Mediterranean diet, which has been shown to help patients reduce the risk of progressive memory disorders and reduces cardiovascular risk. This includes eating fish, eat fruits and green leafy vegetables, nuts like almonds and hazelnuts, walnuts, and also use olive oil. Avoid fast foods and fried foods as much as possible. Avoid sweets and sugar as sugar use has been linked to worsening of memory function.  There is always a concern of gradual progression of memory problems. If this is the case, then we may need to adjust level of care according to patient needs. Support, both to the patient and caregiver, should then be put into place.           Mediterranean Diet A Mediterranean diet refers to food and lifestyle choices that are based on the traditions of countries located on the [employer]. This way of eating has been shown to help prevent certain conditions and improve outcomes for people who have chronic diseases, like kidney disease and heart disease. What are tips for following this plan? Lifestyle  Cook and eat meals together with your family, when possible. Drink enough fluid to keep your urine clear or pale yellow. Be physically active every day. This includes: Aerobic exercise like running or swimming. Leisure activities like gardening, walking, or housework. Get 7-8 hours of sleep each night. If recommended by your health care provider, drink red wine in moderation. This means 1 glass a day for nonpregnant women and 2 glasses a day for men. A glass of wine equals 5 oz (150 mL). Reading food labels  Check the serving size of packaged foods. For foods such as rice and pasta, the serving size refers to the amount of cooked product, not dry. Check the total fat in packaged foods. Avoid foods that have saturated fat or trans fats. Check the ingredients list for added sugars, such as corn syrup. Shopping  At the grocery store, buy most of your food from the areas near the walls of the store. This includes: Fresh fruits and vegetables (produce). Grains, beans, nuts, and seeds. Some of these may be available in unpackaged forms or large amounts (in bulk). Fresh seafood. Poultry and eggs. Low-fat dairy products. Buy whole ingredients instead of prepackaged foods. Buy fresh fruits and vegetables in-season from local farmers markets. Buy frozen fruits and vegetables in resealable bags. If you do not have access to quality fresh seafood, buy precooked frozen shrimp or canned fish, such as  tuna, salmon, or sardines. Buy small amounts of raw or cooked vegetables, salads, or olives from the deli or salad bar at your store. Stock your pantry so you always have certain foods on hand, such as olive oil, canned tuna, canned tomatoes, rice, pasta, and beans. Cooking  Cook foods  with extra-virgin olive oil instead of using butter or other vegetable oils. Have meat as a side dish, and have vegetables or grains as your main dish. This means having meat in small portions or adding small amounts of meat to foods like pasta or stew. Use beans or vegetables instead of meat in common dishes like chili or lasagna. Experiment with different cooking methods. Try roasting or broiling vegetables instead of steaming or sauteing them. Add frozen vegetables to soups, stews, pasta, or rice. Add nuts or seeds for added healthy fat at each meal. You can add these to yogurt, salads, or vegetable dishes. Marinate fish or vegetables using olive oil, lemon juice, garlic, and fresh herbs. Meal planning  Plan to eat 1 vegetarian meal one day each week. Try to work up to 2 vegetarian meals, if possible. Eat seafood 2 or more times a week. Have healthy snacks readily available, such as: Vegetable sticks with hummus. Greek yogurt. Fruit and nut trail mix. Eat balanced meals throughout the week. This includes: Fruit: 2-3 servings a day Vegetables: 4-5 servings a day Low-fat dairy: 2 servings a day Fish, poultry, or lean meat: 1 serving a day Beans and legumes: 2 or more servings a week Nuts and seeds: 1-2 servings a day Whole grains: 6-8 servings a day Extra-virgin olive oil: 3-4 servings a day Limit red meat and sweets to only a few servings a month What are my food choices? Mediterranean diet Recommended Grains: Whole-grain pasta. Brown rice. Bulgar wheat. Polenta. Couscous. Whole-wheat bread. Orpah Cobb. Vegetables: Artichokes. Beets. Broccoli. Cabbage. Carrots. Eggplant. Green beans. Chard. Kale. Spinach. Onions. Leeks. Peas. Squash. Tomatoes. Peppers. Radishes. Fruits: Apples. Apricots. Avocado. Berries. Bananas. Cherries. Dates. Figs. Grapes. Lemons. Melon. Oranges. Peaches. Plums. Pomegranate. Meats and other protein foods: Beans. Almonds. Sunflower seeds. Pine nuts. Peanuts.  Cod. Salmon. Scallops. Shrimp. Tuna. Tilapia. Clams. Oysters. Eggs. Dairy: Low-fat milk. Cheese. Greek yogurt. Beverages: Water. Red wine. Herbal tea. Fats and oils: Extra virgin olive oil. Avocado oil. Grape seed oil. Sweets and desserts: [place] yogurt with honey. Baked apples. Poached pears. Trail mix. Seasoning and other foods: Basil. Cilantro. Coriander. Cumin. Mint. Parsley. Sage. Rosemary. Tarragon. Garlic. Oregano. Thyme. Pepper. Balsalmic vinegar. Tahini. Hummus. Tomato sauce. Olives. Mushrooms. Limit these Grains: Prepackaged pasta or rice dishes. Prepackaged cereal with added sugar. Vegetables: Deep fried potatoes (french fries). Fruits: Fruit canned in syrup. Meats and other protein foods: Beef. Pork. Lamb. Poultry with skin. Hot dogs. Tomasa Blase. Dairy: Ice cream. Sour cream. Whole milk. Beverages: Juice. Sugar-sweetened soft drinks. Beer. Liquor and spirits. Fats and oils: Butter. Canola oil. Vegetable oil. Beef fat (tallow). Lard. Sweets and desserts: Cookies. Cakes. Pies. Candy. Seasoning and other foods: Mayonnaise. Premade sauces and marinades. The items listed may not be a complete list. Talk with your dietitian about what dietary choices are right for you. Summary The Mediterranean diet includes both food and lifestyle choices. Eat a variety of fresh fruits and vegetables, beans, nuts, seeds, and whole grains. Limit the amount of red meat and sweets that you eat. Talk with your health care provider about whether it is safe for you to drink red wine in moderation. This means 1 glass a day for nonpregnant women  and 2 glasses a day for men. A glass of wine equals 5 oz (150 mL). This information is not intended to replace advice given to you by your health care provider. Make sure you discuss any questions you have with your health care provider. Document Released: 01/23/2016 Document Revised: 02/25/2016 Document Reviewed: 01/23/2016 Elsevier Interactive Patient Education  2017  [employer].

## 2021-03-10 ENCOUNTER — Ambulatory Visit (INDEPENDENT_AMBULATORY_CARE_PROVIDER_SITE_OTHER): Payer: Medicaid Other | Admitting: Neurology

## 2021-03-10 ENCOUNTER — Other Ambulatory Visit: Payer: Self-pay

## 2021-03-10 DIAGNOSIS — R404 Transient alteration of awareness: Secondary | ICD-10-CM

## 2021-03-12 ENCOUNTER — Other Ambulatory Visit: Payer: Self-pay

## 2021-03-12 ENCOUNTER — Telehealth (INDEPENDENT_AMBULATORY_CARE_PROVIDER_SITE_OTHER): Payer: Medicaid Other | Admitting: Neurology

## 2021-03-12 ENCOUNTER — Encounter: Payer: Self-pay | Admitting: Neurology

## 2021-03-12 VITALS — Ht 66.0 in | Wt 269.0 lb

## 2021-03-12 DIAGNOSIS — G40009 Localization-related (focal) (partial) idiopathic epilepsy and epileptic syndromes with seizures of localized onset, not intractable, without status epilepticus: Secondary | ICD-10-CM

## 2021-03-12 MED ORDER — OXCARBAZEPINE 300 MG PO TABS: ORAL_TABLET | ORAL | 6 refills | Status: DC
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
## 2021-03-12 NOTE — Progress Notes (Signed)
Had another episode on over the weekend, her eyes are straining, not sleeping well.
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
## 2021-03-18 ENCOUNTER — Telehealth: Payer: Self-pay | Admitting: Neurology

## 2021-03-18 NOTE — Telephone Encounter (Signed)
Patient called and requested a letter to be sent to her school counselor: Elodia Florence, Director of Student Success Counseling.  The letter will need to address the patient medical history as it relates to her education. She said she needed to drop out as it was very hard to focus.  Patient is planning on re-enrolling in her studies again to start next fall.  Tiffany Starkes [address] [place], [place] [address]

## 2021-03-25 ENCOUNTER — Ambulatory Visit: Payer: Self-pay | Admitting: *Deleted

## 2021-03-25 ENCOUNTER — Telehealth: Payer: Self-pay | Admitting: Neurology

## 2021-03-25 MED ORDER — OXCARBAZEPINE 300 MG PO TABS: ORAL_TABLET | ORAL | 4 refills | Status: DC

## 2021-03-25 NOTE — Addendum Note (Signed)
Addended by: Dimas Chyle on: 03/25/2021 11:38 AM   Modules accepted: Orders

## 2021-03-25 NOTE — Telephone Encounter (Signed)
Pls let her know I would like to increase the oxcarbazepine a little more: oxcarbazepine 300mg : Take 3 tablets twice a day. Pls send in updated Rx. The only monitor currently available for seizures is designed for people with convulsions, there is none yet available to monitor for blanking out seizures. Thanks

## 2021-03-25 NOTE — Telephone Encounter (Signed)
Pt thinks she is still having seizures. She goes walking every am, and she thinks she had one  b/c she dropped the dog leash. She bought a dog in hopes the dog can let her know if she's having seizures She would like to know if aquino can recommend someone to train the dog to do so. She also would like to know if aquio can get her a monitor to see if she is having seizures

## 2021-03-25 NOTE — Telephone Encounter (Signed)
Pt's BF calling , spoke with pt as well. BF states pt has had several "Frozen up episodes" today. States seizure activity, "Blanking out." States pt had acted "Off" all day, "Extremely sleepy." Asked to speak to pt, "She's asleep on couch. " Asked to wake pt as I needed to speak with her. Pt states she has been extremely sleepy. BF states again "Not acting right." Advised ED. States he will probably need to call EMS. Advised to do so. Verbalizes understanding.  Answer Assessment - Initial Assessment Questions 1. ONSET: "How long did the seizure last?" (e.g., seconds, minutes)      Please see summary. None observed. 2. CONTENT: "Describe what happened during the seizure. Did the body become stiff? Was there any jerking?"      *No Answer* 3. CIRCUMSTANCE: "What was the person doing when the seizure began?"      *No Answer* 4. MENTAL STATUS: "Does the person know who they are, who you are, and where they are?"      *No Answer* 5. PRIOR SEIZURES: "Has the person had a seizure (convulsion) before?" If Yes, ask: "When was the last time?" and "What happened last time?"     *No Answer* 6. EPILEPSY: "Does the person have epilepsy?" Note: Check for medical ID bracelet.     *No Answer* 7. MEDICINES: "Does the person take anticonvulsant medications?" (e.g., Yes, No; compliance, any recent changes)     *No Answer* 8. INJURY: "Did the person hurt himself during the seizure?" (e.g., head, tongue)     *No Answer* 9. OTHER SYMPTOMS: "Are there any other symptoms?" (e.g., fever, headache)     *No Answer* 10. PREGNANCY: "Is there any chance you are pregnant?" "When was your last menstrual period?"       *No Answer*  Protocols used: [employer]

## 2021-03-25 NOTE — Telephone Encounter (Signed)
Pt called an informed that dr Karel Jarvis would like to increase the oxcarbazepine a little more: oxcarbazepine 300mg : Take 3 tablets twice a day. She was also informed The only monitor currently available for seizures is designed for people with convulsions, there is none yet available to monitor for blanking out seizures

## 2021-03-25 NOTE — Telephone Encounter (Signed)
Pt c/o: seizure Missed medications?  No. Sleep deprived?  Yes.   Normally in bed at 9 or 10 last night up till 12  Alcohol intake?  No. Back to their usual baseline self?  No.. If no, advise go to ER Current medications prescribed by Dr. Karel Jarvis: oxcarbazepine  2 tablets twice a day  Any increase in stress? Yes has to move soon looking for a place.  Medication is helping with her mood   Information about service dog put in the mail for pt

## 2021-03-27 NOTE — Telephone Encounter (Signed)
Patient called wanting to check in with [facility]. She wants to confirm her medication dosing directions for oxcarbazepine.

## 2021-03-27 NOTE — Telephone Encounter (Signed)
Pt called and went over dosing and direction for oxcarbazepine, pt stated that she started her period she feels these episodes happened around the start of her period.

## 2021-04-01 ENCOUNTER — Other Ambulatory Visit: Payer: Self-pay

## 2021-04-01 ENCOUNTER — Ambulatory Visit
Admission: RE | Admit: 2021-04-01 | Discharge: 2021-04-01 | Disposition: A | Discharge status: Home / Self Care | Payer: Medicaid Other | Source: Ambulatory Visit | Attending: Neurology | Admitting: Neurology

## 2021-04-01 ENCOUNTER — Ambulatory Visit: Payer: Medicaid Other | Admitting: Podiatry

## 2021-04-01 DIAGNOSIS — G40009 Localization-related (focal) (partial) idiopathic epilepsy and epileptic syndromes with seizures of localized onset, not intractable, without status epilepticus: Secondary | ICD-10-CM

## 2021-04-01 MED ORDER — GADOBENATE DIMEGLUMINE 529 MG/ML IV SOLN
20.0000 mL | Freq: Once | INTRAVENOUS | Status: AC | PRN
  Administered 2021-04-01: 20 mL via INTRAVENOUS

## 2021-04-07 ENCOUNTER — Ambulatory Visit: Payer: Medicaid Other | Admitting: Podiatry

## 2021-04-09 ENCOUNTER — Telehealth: Payer: Self-pay | Admitting: Neurology

## 2021-04-09 NOTE — Telephone Encounter (Signed)
Pt called again, would like a call to discuss her results. [phone]

## 2021-04-09 NOTE — Telephone Encounter (Signed)
We don't have any results yet from the ER. If she is still in the ER, pls have her speak to her treating provider about results from the ER. Did they change her medication?

## 2021-04-09 NOTE — Telephone Encounter (Signed)
Pt called she was still in the ER about to get DC she said they told her everything looked good. She stated that she blacked out and that her daughter had to call 911. She stated that they did not change any of her medications, she is asking about her MRI results,

## 2021-04-09 NOTE — Telephone Encounter (Signed)
Pt called in stating she was currently in the emergency department at [facility]. She had a seizure today and the EMS took her there.

## 2021-04-09 NOTE — Telephone Encounter (Signed)
See other phone note

## 2021-04-09 NOTE — Telephone Encounter (Signed)
Patient's significant other, Richard, called and expressed concern about the patient having another seizure today.  He said she was taken by EMS to a [place] emergency department.   He is requesting a call back with the MRI results from a week ago from 04/01/21.  He said that is what the patient was calling about earlier today. She hasn't gotten the results yet either.

## 2021-04-10 NOTE — Telephone Encounter (Signed)
Pt called an informed of MRI results. She stated that she was eating when she had her seizure, pt stated that she was started on Geodon, pt is asking about therapy she stated that her body hurts from the spells and she thinks she may need some kind of PT and she also asked about pain meds or relaxer's pt advised that we do not do pain medication here in the office she would have to ask her PCP if she needed something for pain.

## 2021-04-10 NOTE — Telephone Encounter (Signed)
Pls let her know the brain MRI was normal, no tumor, stroke, or bleed. Any triggers for the seizure? Thanks

## 2021-04-10 NOTE — Telephone Encounter (Signed)
Patient returned call to Heather. 

## 2021-04-10 NOTE — Telephone Encounter (Signed)
Pt called no answer left a voice mail to call the office back  °

## 2021-04-16 ENCOUNTER — Telehealth: Payer: Self-pay | Admitting: Neurology

## 2021-04-16 ENCOUNTER — Other Ambulatory Visit: Payer: Self-pay | Admitting: Neurology

## 2021-04-16 DIAGNOSIS — R52 Pain, unspecified: Secondary | ICD-10-CM

## 2021-04-16 DIAGNOSIS — G40009 Localization-related (focal) (partial) idiopathic epilepsy and epileptic syndromes with seizures of localized onset, not intractable, without status epilepticus: Secondary | ICD-10-CM

## 2021-04-16 MED ORDER — OXTELLAR XR 600 MG PO TB24: ORAL_TABLET | ORAL | 5 refills | Status: DC

## 2021-04-16 NOTE — Telephone Encounter (Signed)
Pt called she asked if she can get something for her seizures that last longer and not make her sleepy? She is asking again about therapy? She said that her shoulder and her feet hurt. She wanted you to know that she see podiatry on 04/22/21

## 2021-04-16 NOTE — Telephone Encounter (Signed)
See other phone note

## 2021-04-16 NOTE — Telephone Encounter (Signed)
Would do bilateral shoulder xray to make sure there is no dislocation. If none, we will refer to physical therapy. We will switch the oxcarbazepine to the extended-release tablet that she would take 2 tablets once a day. If it makes her sleepy, have her take the 2 tablets all at night. I will send in Rx for Oxtellar XR 600mg : take 2 tabs every night. Thanks

## 2021-04-16 NOTE — Telephone Encounter (Signed)
Pt called and informed Would do bilateral shoulder xray to make sure there is no dislocation. If none, we will refer to physical therapy. We will switch the oxcarbazepine to the extended-release tablet that she would take 2 tablets once a day. If it makes her sleepy, have her take the 2 tablets all at night. Dr Karel Jarvis sent script in for pt. Bilateral shoulder xray ordered

## 2021-04-16 NOTE — Telephone Encounter (Signed)
Patient called after hours and left a message she's like a call back from a clinical staff member, she has questions about her medications.

## 2021-04-17 ENCOUNTER — Telehealth: Payer: Self-pay | Admitting: Neurology

## 2021-04-17 NOTE — Telephone Encounter (Signed)
Noted, thanks!

## 2021-04-17 NOTE — Telephone Encounter (Signed)
Pt called she has theories about her seizures, after talking to a friend she wants to know if it could come from her BP? Her BP is 129/83 pt advised last BP in chart is normal, then pt wanted to know if she gets it from her great  grandpa that she didn't know who her auntie said was a old drunk.  PT advised that I am not sure where she gets her seizures from but she has them. Pt then asked aftr 6 months if no episodes and she starts driving can she stop her meds she was told NO she has to keep taken her medication so she doesn't have an episode, pt then asked how long will she be on it? I told her I didn't know it could be years or the rest of her life I don't know, pt stated she thinks the medication it working

## 2021-04-21 NOTE — Telephone Encounter (Signed)
Pt called no answer left a voice mail to call the office back  °

## 2021-04-22 ENCOUNTER — Ambulatory Visit: Payer: Medicaid Other | Admitting: Podiatry

## 2021-04-22 ENCOUNTER — Ambulatory Visit (INDEPENDENT_AMBULATORY_CARE_PROVIDER_SITE_OTHER): Payer: Medicaid Other

## 2021-04-22 ENCOUNTER — Other Ambulatory Visit: Payer: Self-pay

## 2021-04-22 DIAGNOSIS — M79672 Pain in left foot: Secondary | ICD-10-CM | POA: Diagnosis not present

## 2021-04-22 DIAGNOSIS — M7751 Other enthesopathy of right foot: Secondary | ICD-10-CM | POA: Diagnosis not present

## 2021-04-22 DIAGNOSIS — M7671 Peroneal tendinitis, right leg: Secondary | ICD-10-CM | POA: Diagnosis not present

## 2021-04-22 DIAGNOSIS — M7752 Other enthesopathy of left foot: Secondary | ICD-10-CM

## 2021-04-22 DIAGNOSIS — M76822 Posterior tibial tendinitis, left leg: Secondary | ICD-10-CM

## 2021-04-22 DIAGNOSIS — M7750 Other enthesopathy of unspecified foot: Secondary | ICD-10-CM

## 2021-04-22 DIAGNOSIS — Q742 Other congenital malformations of lower limb(s), including pelvic girdle: Secondary | ICD-10-CM

## 2021-04-22 MED ORDER — MELOXICAM 15 MG PO TABS: 15.0000 mg | ORAL_TABLET | Freq: Every day | ORAL | 3 refills | Status: DC

## 2021-04-22 NOTE — Telephone Encounter (Signed)
Pt called back she stated that seh has noticed that everyday she has been shaking, she does not want her life to be limited. She has not started the oxtellar she said that she would call the pharmacy to see if they have it. Pt would like to apply for disability she was told she needs to call [employer] office to find out the steps to apply, she then asked if she can be cured from seizures so she can go into the [employer]. Pt is upset that she cant not drive. Pt asked about going to get her xrays. Pt advised the orders have been in she can go to [facility] and have them done at anytime,

## 2021-04-22 NOTE — Telephone Encounter (Signed)
Patient called requesting a referral to a counselor that can help her with some depression she was struggling with prior to her epilepsy diagnosis and is continuing to struggle with.  She said the one she has been trying to get established with has been non-responsive.

## 2021-04-22 NOTE — Patient Instructions (Signed)
Look for Voltaren gel at the pharmacy over the counter or online (also known as diclofenac 1% gel). Apply to the painful areas 3-4x daily with the supplied dosing card. Allow to dry for 10 minutes before going into socks/shoes  Shoes we discussed are [employer], New Balance   Wear the boot on the left side and rest as much as you can  For the right side, wear an ankle brace and do the exercises below 2x daily     Peroneal Tendinopathy Rehab Ask your health care provider which exercises are safe for you. Do exercises exactly as told by your health care provider and adjust them as directed. It is normal to feel mild stretching, pulling, tightness, or discomfort as you do these exercises. Stop right away if you feel sudden pain or your pain gets worse. Do not begin these exercises until told by your health care provider. Stretching and range-of-motion exercises These exercises warm up your muscles and joints and improve the movement and flexibility of your ankle. These exercises also help to relieve pain and stiffness. Gastroc and soleus stretch, standing  This is an exercise in which you stand on a step and use your body weight to stretch your calf muscles. To do this exercise: Stand on the edge of a step on the ball of your left / right foot. The ball of your foot is on the walking surface, right under your toes. Keep your other foot firmly on the same step. Hold on to the wall, a railing, or a chair for balance. Slowly lift your other foot, allowing your body weight to press your left / right heel down over the edge of the step. You should feel a stretch in your left / right calf (gastrocnemius and soleus). Hold this position for 15 seconds. Return both feet to the step. Repeat this exercise with a slight bend in your left / right knee. Repeat 5 times with your left / right knee straight and 5 times with your left / right knee bent. Complete this exercise 2 times a day. Strengthening  exercises These exercises build strength and endurance in your foot and ankle. Endurance is the ability to use your muscles for a long time, even after they get tired. Ankle dorsiflexion with band   Secure a rubber exercise band or tube to an object, such as a table leg, that will not move when the band is pulled. Secure the other end of the band around your left / right foot. Sit on the floor, facing the object with your left / right leg extended. The band or tube should be slightly tense when your foot is relaxed. Slowly flex your left / right ankle and toes to bring your foot toward you (dorsiflexion). Hold this position for 15 seconds. Let the band or tube slowly pull your foot back to the starting position. Repeat 5 times. Complete this exercise 2 times a day. Ankle eversion Sit on the floor with your legs straight out in front of you. Loop a rubber exercise band or tube around the ball of your left / right foot. The ball of your foot is on the walking surface, right under your toes. Hold the ends of the band in your hands, or secure the band to a stable object. The band or tube should be slightly tense when your foot is relaxed. Slowly push your foot outward, away from your other leg (eversion). Hold this position for 15 seconds. Slowly return your foot to  the starting position. Repeat 5 times. Complete this exercise 2 times a day. Plantar flexion, standing  This exercise is sometimes called standing heel raise. Stand with your feet shoulder-width apart. Place your hands on a wall or table to steady yourself as needed, but try not to use it for support. Keep your weight spread evenly over the width of your feet while you slowly rise up on your toes (plantar flexion). If told by your health care provider: Shift your weight toward your left / right leg until you feel challenged. Stand on your left / right leg only. Hold this position for 15 seconds. Repeat 2 times. Complete this  exercise 2 times a day. Single leg stand Without shoes, stand near a railing or in a doorway. You may hold on to the railing or door frame as needed. Stand on your left / right foot. Keep your big toe down on the floor and try to keep your arch lifted. Do not roll to the outside of your foot. If this exercise is too easy, you can try it with your eyes closed or while standing on a pillow. Hold this position for 15 seconds. Repeat 5 times. Complete this exercise 2 times a day. This information is not intended to replace advice given to you by your health care provider. Make sure you discuss any questions you have with your health care provider. Document Revised: 09/20/2018 Document Reviewed: 09/20/2018 Elsevier Patient Education  2020 [employer].

## 2021-04-23 ENCOUNTER — Emergency Department (HOSPITAL_COMMUNITY)
Admission: EM | Admit: 2021-04-23 | Discharge: 2021-04-25 | Disposition: A | Discharge status: Other Acute Inpatient Hospital | Payer: Medicaid Other | Attending: Emergency Medicine | Admitting: Emergency Medicine

## 2021-04-23 ENCOUNTER — Other Ambulatory Visit: Payer: Self-pay

## 2021-04-23 ENCOUNTER — Encounter (HOSPITAL_COMMUNITY): Payer: Self-pay

## 2021-04-23 DIAGNOSIS — Z20822 Contact with and (suspected) exposure to covid-19: Secondary | ICD-10-CM | POA: Insufficient documentation

## 2021-04-23 DIAGNOSIS — Z87891 Personal history of nicotine dependence: Secondary | ICD-10-CM | POA: Insufficient documentation

## 2021-04-23 DIAGNOSIS — Y900 Blood alcohol level of less than 20 mg/100 ml: Secondary | ICD-10-CM | POA: Diagnosis not present

## 2021-04-23 DIAGNOSIS — J45909 Unspecified asthma, uncomplicated: Secondary | ICD-10-CM | POA: Diagnosis not present

## 2021-04-23 DIAGNOSIS — Z1330 Encounter for screening examination for mental health and behavioral disorders, unspecified: Secondary | ICD-10-CM | POA: Insufficient documentation

## 2021-04-23 DIAGNOSIS — R45850 Homicidal ideations: Secondary | ICD-10-CM | POA: Diagnosis not present

## 2021-04-23 DIAGNOSIS — R45851 Suicidal ideations: Secondary | ICD-10-CM | POA: Diagnosis not present

## 2021-04-23 DIAGNOSIS — N9489 Other specified conditions associated with female genital organs and menstrual cycle: Secondary | ICD-10-CM | POA: Insufficient documentation

## 2021-04-23 HISTORY — DX: Unspecified convulsions: R56.9

## 2021-04-23 LAB — RAPID URINE DRUG SCREEN, HOSP PERFORMED
Amphetamines: NOT DETECTED
Barbiturates: NOT DETECTED
Benzodiazepines: NOT DETECTED
Cocaine: NOT DETECTED
Opiates: NOT DETECTED
Tetrahydrocannabinol: NOT DETECTED

## 2021-04-23 LAB — COMPREHENSIVE METABOLIC PANEL
ALT: 23 U/L (ref 0–44)
AST: 18 U/L (ref 15–41)
Albumin: 3.7 g/dL (ref 3.5–5.0)
Alkaline Phosphatase: 46 U/L (ref 38–126)
Anion gap: 6 (ref 5–15)
BUN: 12 mg/dL (ref 6–20)
CO2: 23 mmol/L (ref 22–32)
Calcium: 8.7 mg/dL — ABNORMAL LOW (ref 8.9–10.3)
Chloride: 105 mmol/L (ref 98–111)
Creatinine, Ser: 0.67 mg/dL (ref 0.44–1.00)
GFR, Estimated: >60 mL/min (ref >60)
Glucose, Bld: 93 mg/dL (ref 70–99)
Potassium: 4 mmol/L (ref 3.5–5.1)
Sodium: 134 mmol/L — ABNORMAL LOW (ref 135–145)
Total Bilirubin: 0.3 mg/dL (ref 0.3–1.2)
Total Protein: 7 g/dL (ref 6.5–8.1)

## 2021-04-23 LAB — CBC
HCT: 37.6 % (ref 36.0–46.0)
Hemoglobin: 12.2 g/dL (ref 12.0–15.0)
MCH: 28.8 pg (ref 26.0–34.0)
MCHC: 32.4 g/dL (ref 30.0–36.0)
MCV: 88.7 fL (ref 80.0–100.0)
Platelets: 256 10*3/uL (ref 150–400)
RBC: 4.24 MIL/uL (ref 3.87–5.11)
RDW: 14.8 % (ref 11.5–15.5)
WBC: 8.4 10*3/uL (ref 4.0–10.5)
nRBC: 0 % (ref 0.0–0.2)

## 2021-04-23 LAB — ACETAMINOPHEN LEVEL: Acetaminophen (Tylenol), Serum: <10 ug/mL — ABNORMAL LOW (ref 10–30)

## 2021-04-23 LAB — ETHANOL: Alcohol, Ethyl (B): <10 mg/dL (ref <10)

## 2021-04-23 LAB — SALICYLATE LEVEL: Salicylate Lvl: <7 mg/dL — ABNORMAL LOW (ref 7.0–30.0)

## 2021-04-23 LAB — HCG, QUANTITATIVE, PREGNANCY: hCG, Beta Chain, Quant, S: <1 m[IU]/mL (ref <5)

## 2021-04-23 MED ORDER — SERTRALINE HCL 50 MG PO TABS
100.0000 mg | ORAL_TABLET | Freq: Every day | ORAL | Status: DC
  Administered 2021-04-24 – 2021-04-25 (×2): 100 mg via ORAL
  Filled 2021-04-23 (×2): qty 2

## 2021-04-23 NOTE — ED Provider Notes (Signed)
[facility]-EMERGENCY DEPT Provider Note   CSN: [id number] Arrival date & time: 04/23/21  2009     History Chief Complaint  Patient presents with   Suicidal    Jeanne T Zellman is a 30 y.o. female.  Patient presents to ER chief complaint of thoughts of wanting to hurt herself or her partner.  She states that recent events have caused a lot of stress for her and she feels like she can "handle it."  Patient otherwise unwilling to go into additional details but she states that she has thoughts of hurting herself or other people.  Denies any fevers or cough or vomiting or diarrhea.  She had a recent fall about 2 weeks ago with a sprain in the left ankle.  Denies any fracture.      Past Medical History:  Diagnosis Date   Asthma    Infection    UTI X1   Infection 06/15/2009   HSV   Infection    CHLAMYDIA  ?   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   Supervision of other normal pregnancy, antepartum 10/26/2016   Asthma affecting pregnancy, antepartum 10/26/2016   Tobacco smoking affecting pregnancy, antepartum 10/26/2016   Genital herpes affecting pregnancy, antepartum 10/26/2016   Supraumbilical hernia 09/28/2016   Herpes 06/01/2012   Hx sexually transmitted disease (STD) 02/11/2012    Past Surgical History:  Procedure Laterality Date   NO PAST SURGERIES       OB History     Gravida  2   Para  1   Term  1   Preterm  0   AB  0   Living  1      SAB  0   IAB  0   Ectopic  0   Multiple  0   Live Births  1           Family History  Problem Relation Age of Onset   Diabetes Maternal Grandmother    Stroke Maternal Grandmother    Pulmonary embolism Maternal Grandfather     Social History   Tobacco Use   Smoking status: Former    Packs/day:  0.30    Types: Cigarettes    Quit date: 10/19/2016    Years since quitting: 4.5   Smokeless tobacco: Never  Vaping Use   Vaping Use: Never used  Substance Use Topics   Alcohol use: Yes    Comment: OCC MIXED DRINK;  NONE SINCE + UPT   Drug use: No    Home Medications Prior to Admission medications   Medication Sig Start Date End Date Taking? Authorizing Provider  meloxicam (MOBIC) 15 MG tablet Take 1 tablet (15 mg total) by mouth daily. 04/22/21  Yes McDonald, Rachelle Hora, DPM  sertraline (ZOLOFT) 100 MG tablet Take 100 mg by mouth daily. 04/14/21  Yes [provider]  ziprasidone (GEODON) 20 MG capsule Take 20 mg by mouth See admin instructions. Take 1 capsule by mouth daily for 7 days then 2 capsules daily with dinner 03/24/21  Yes [provider]  OXcarbazepine ER (OXTELLAR XR) 600 MG TB24 Take 2 tablets every night Patient taking differently: Take 2 tablets by mouth every night 04/16/21   Van Clines, MD    Allergies  Pollen extract  Review of Systems   Review of Systems  Constitutional:  Negative for fever.  HENT:  Negative for ear pain.   Eyes:  Negative for pain.  Respiratory:  Negative for cough.   Cardiovascular:  Negative for chest pain.  Gastrointestinal:  Negative for abdominal pain.  Genitourinary:  Negative for flank pain.  Musculoskeletal:  Negative for back pain.  Skin:  Negative for rash.  Neurological:  Negative for headaches.   Physical Exam Updated Vital Signs BP 138/89 (BP Location: Left Arm)   Pulse 74   Temp 97.8 F (36.6 C) (Oral)   Resp 20   Ht 5\' 6"  (1.676 m)   Wt 124.7 kg   SpO2 99%   BMI 44.39 kg/m   Physical Exam Constitutional:      General: She is not in acute distress.    Appearance: Normal appearance.  HENT:     Head: Normocephalic.     Nose: Nose normal.  Eyes:     Extraocular Movements: Extraocular movements intact.  Cardiovascular:     Rate and Rhythm: Normal rate.  Pulmonary:     Effort: Pulmonary  effort is normal.  Musculoskeletal:        General: Normal range of motion.     Cervical back: Normal range of motion.  Neurological:     General: No focal deficit present.     Mental Status: She is alert. Mental status is at baseline.  Psychiatric:     Comments: Patient's mood appears agitated.  Otherwise cooperative.    ED Results / Procedures / Treatments   Labs (all labs ordered are listed, but only abnormal results are displayed) Labs Reviewed  COMPREHENSIVE METABOLIC PANEL - Abnormal; Notable for the following components:      Result Value   Sodium 134 (*)    Calcium 8.7 (*)    All other components within normal limits  SALICYLATE LEVEL - Abnormal; Notable for the following components:   Salicylate Lvl <7.0 (*)    All other components within normal limits  ACETAMINOPHEN LEVEL - Abnormal; Notable for the following components:   Acetaminophen (Tylenol), Serum <10 (*)    All other components within normal limits  ETHANOL  CBC  RAPID URINE DRUG SCREEN, HOSP PERFORMED  HCG, QUANTITATIVE, PREGNANCY    EKG None  Radiology No results found.  Procedures Procedures   Medications Ordered in ED Medications - No data to display  ED Course  I have reviewed the triage vital signs and the nursing notes.  Pertinent labs & imaging results that were available during my care of the patient were reviewed by me and considered in my medical decision making (see chart for details).    MDM Rules/Calculators/A&P                           Labs unremarkable.  Patient medically cleared for further crisis evaluation.  Final Clinical Impression(s) / ED Diagnoses Final diagnoses:  Homicidal thoughts  Suicidal thoughts    Rx / DC Orders ED Discharge Orders     None        , MD 04/23/21 [phone]

## 2021-04-23 NOTE — Progress Notes (Signed)
  Subjective:  Patient ID: Jeanne [name], female    DOB: [birth year],  MRN: [id number]  Chief Complaint  Patient presents with   Foot Pain      bilateral foot pain on top heel and sides    30 y.o. female presents with the above complaint. History confirmed with patient.  She has had ongoing left foot and right foot pain through the ankles for several months.  The right foot is relatively new.  This was exacerbated by a fall on 10/26 she went to the [facility] ER they took x-rays of the left ankle and showed no fracture.  She thinks may have rolled her ankle and had a seizure.  Most the pain is on the inside of the medial arch on the left side this is the worst bottom  Objective:  Physical Exam: warm, good capillary refill, no trophic changes or ulcerative lesions, normal DP and PT pulses, and normal sensory exam. Left Foot: Prominent navicular bone, she has pain on palpation here and with resisted inversion, she has pes planus deformity Right Foot: Pain on the fifth metatarsal base with pain along the peroneal tendons at the insertion and pain with resisted eversion  No images are attached to the encounter.  Radiographs: Multiple views x-ray of both feet: no fracture, dislocation, swelling or degenerative changes noted and she has prominent navicular tuberosities bilateral the left side shows a lucency within this Assessment:   1. 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 Peroneal tendinitis, right   3. Posterior tibial tendinitis of left lower extremity      Plan:  Patient was evaluated and treated and all questions answered.  Reviewed my clinical findings and the radiographs in detail with the patient.  I discussed with her she likely has predisposition of an accessory navicular bone.  The one the left side is the prominent issue and I suspect this may have fractured or have a synchondrosis that is inflamed.  We discussed surgical and nonsurgical treatment.  I think  she likely will end up with surgical intervention for this and I recommend an MRI to evaluate for bony inflammation along the navicular bone.  The right foot appears to be overcompensation peroneal tendinitis and I have given her home exercises for this.  CAM boot prescription was given to her for [facility] and she will get this today for the left side.  Recommend an ankle brace on the right side.  Meloxicam sent to pharmacy, she has been taking ibuprofen so far.  Return for after MRI to review (call to schedule after MRI is scheduled 1-2 weeks after).

## 2021-04-23 NOTE — ED Notes (Signed)
Patient's mother Abran Duke called.  [phone].  Patient instructed writer to not give any information to mother.  Mother informed.

## 2021-04-23 NOTE — ED Triage Notes (Signed)
Pt reports suicidal / homicidal ideations toward baby daddy. Pt wishes not to vocalize plan. Noncompliant with prescribed Geodon. Took seizure medication prior to arrival.

## 2021-04-24 LAB — URINALYSIS, ROUTINE W REFLEX MICROSCOPIC
Bacteria, UA: NONE SEEN
Bilirubin Urine: NEGATIVE
Glucose, UA: NEGATIVE mg/dL
Ketones, ur: NEGATIVE mg/dL
Leukocytes,Ua: NEGATIVE
Nitrite: NEGATIVE
Protein, ur: NEGATIVE mg/dL
Specific Gravity, Urine: 1.01 (ref 1.005–1.030)
pH: 6 (ref 5.0–8.0)

## 2021-04-24 LAB — RESP PANEL BY RT-PCR (FLU A&B, COVID) ARPGX2
Influenza A by PCR: NEGATIVE
Influenza B by PCR: NEGATIVE
SARS Coronavirus 2 by RT PCR: NEGATIVE

## 2021-04-24 MED ORDER — MELOXICAM 15 MG PO TABS
15.0000 mg | ORAL_TABLET | Freq: Every day | ORAL | Status: DC
  Administered 2021-04-24: 15 mg via ORAL
  Filled 2021-04-24 (×2): qty 1

## 2021-04-24 MED ORDER — QUETIAPINE FUMARATE 25 MG PO TABS
25.0000 mg | ORAL_TABLET | Freq: Two times a day (BID) | ORAL | Status: DC
  Administered 2021-04-24 – 2021-04-25 (×3): 25 mg via ORAL
  Filled 2021-04-24 (×3): qty 1

## 2021-04-24 MED ORDER — OXCARBAZEPINE ER 600 MG PO TB24: 1200.0000 mg | ORAL_TABLET | Freq: Every day | ORAL | Status: DC

## 2021-04-24 NOTE — ED Provider Notes (Signed)
Pt is accepted to [facility]. Accepting provider is: Cherrie Distance, PA-C. Patient remains calm. Will transfer to accepting facility.    Jeanne Savoy, MD 04/24/21 [phone]

## 2021-04-24 NOTE — Telephone Encounter (Signed)
Can try the pamphlet we have in the office, [email or web address], their website says they accept Medicaid. Number listed for Ginette Otto is [phone]. Thanks
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
## 2021-04-24 NOTE — Progress Notes (Signed)
@  10:50pm CSW updated by Susy Frizzle, MD that disp and EMTALA are completed. Gretta Arab, RN reported that UA and IVC paperwork has been faxed and RN agreed to call and coordinate transportation.    Maryjean Ka, MSW, LCSWA 04/24/2021 11:12 PM

## 2021-04-24 NOTE — Progress Notes (Signed)
Pt is accepted to [facility] PENDING UA as of 04/24/21. Bed Assignment: [facility].   Accepting provider is: Cherrie Distance, PA-C, [facility].   Pt will admit to [facility] and can arrive at anytime after the UA is received. Phone number for report: [phone].  CSW called and spoke with nursing caring for pt Jeanne Arab, RN. To informed about accepting bed offer. CSW was informed that order for UA has been completed but UA has yet to be collected.   Per secure chat with Jeanne Arab, RN; CSW requested that RN fax UA to Fax number is [phone] along with faxing IVC paperwork. CSW also requested that EDP be added to chat so that EDP is aware of pt's disposition. CSW requesting that nursing coordinate transportation. CSW will continue to assist and follow pt with placement needs for inpatient behavioral health placement.  Maryjean Ka, MSW, LCSWA 04/24/2021 9:40 PM

## 2021-04-24 NOTE — ED Notes (Signed)
UA and IVC paperwork faxed to [phone]

## 2021-04-24 NOTE — Progress Notes (Signed)
Patient has been denied by [facility] due to no appropriate beds available. Patient meets BH inpatient criteria per Caryn Bee, DNP. Patient has been faxed out to the following facilities:  [facility]  [address], [place] [address] [phone]  [facility]  [address], [place] [address] [phone]  [facility]  [address] [place] [address] [phone]  [facility]  [address] [place] [address] [phone]  [facility]  [address] [place] [address] [phone]  [facility]  [address] [place], [place] [address] [phone]  [facility]  [address] [place], [place] [address] [phone]  [facility]  [phone] [place] [address] [phone]  [facility]  [address] [place] [address] [phone]  [facility]  [address] [place] [address] [phone]  [facility]  [address] [place] [address] [phone]  [facility]  [address] [place] [address] [phone]   Damita Dunnings, MSW, LCSW-A  2:50 PM 04/24/2021

## 2021-04-24 NOTE — Telephone Encounter (Signed)
Left message to call office to speak with me 1:47pm

## 2021-04-24 NOTE — ED Notes (Signed)
Pt states Mother may be informed of Pt progress and discuss chart [phone]

## 2021-04-25 MED ORDER — NICOTINE 21 MG/24HR TD PT24
21.0000 mg | MEDICATED_PATCH | Freq: Every day | TRANSDERMAL | Status: DC
  Administered 2021-04-25: 21 mg via TRANSDERMAL
  Filled 2021-04-25: qty 1

## 2021-04-25 MED ORDER — OXCARBAZEPINE 300 MG PO TABS
900.0000 mg | ORAL_TABLET | Freq: Two times a day (BID) | ORAL | Status: DC
  Administered 2021-04-25 (×2): 900 mg via ORAL
  Filled 2021-04-25 (×2): qty 3

## 2021-04-25 NOTE — ED Notes (Signed)
Report called and VM left for Sheriff to transport.

## 2021-04-25 NOTE — Telephone Encounter (Signed)
Left message of the number and to stop and get the pamphlet.

## 2021-04-25 NOTE — ED Notes (Signed)
Patient off unit to facility per provider.  Patient alert, calm, cooperative, no s/s of distress. DC information and belongings given to sheriff for transport.  Patient ambulatory off unit, escorted and transported by sheriff. 

## 2021-05-02 ENCOUNTER — Telehealth: Payer: Self-pay | Admitting: *Deleted

## 2021-05-02 ENCOUNTER — Encounter: Payer: Self-pay | Admitting: Neurology

## 2021-05-02 NOTE — Telephone Encounter (Signed)
Letter done, pls mail as requested to   [facility] [address] [place], [place] [address]  Thanks

## 2021-05-02 NOTE — Telephone Encounter (Signed)
Letter placed in the mail.

## 2021-05-02 NOTE — Telephone Encounter (Signed)
Patient is calling because she has lost her bottle of meloxicam at hospital, requesting another refill approval if possible. Please advise.

## 2021-05-05 MED ORDER — MELOXICAM 15 MG PO TABS
15.0000 mg | ORAL_TABLET | Freq: Every day | ORAL | 3 refills | Status: DC
Start: 2021-05-05 — End: 2021-05-28

## 2021-05-05 NOTE — Telephone Encounter (Signed)
Returned the call to patient, no answer, left voice message giving information that medication has been approved and refills sent to pharmacy on file.

## 2021-05-14 ENCOUNTER — Telehealth: Payer: Self-pay | Admitting: Neurology

## 2021-05-14 NOTE — Telephone Encounter (Signed)
Patient went to [facility], she is going to ask them to send information to [phone].I advised her that Dr.Aquino is not here for another 2 weeks.

## 2021-05-14 NOTE — Telephone Encounter (Signed)
Pt wants to know if Behavioral health told aquino she had a seizure while in the office on 04/26/21. Would like a call back to discuss

## 2021-05-28 ENCOUNTER — Other Ambulatory Visit: Payer: Self-pay

## 2021-05-28 ENCOUNTER — Telehealth: Payer: Self-pay | Admitting: *Deleted

## 2021-05-28 ENCOUNTER — Ambulatory Visit
Admission: RE | Admit: 2021-05-28 | Discharge: 2021-05-28 | Disposition: A | Discharge status: Home / Self Care | Payer: Medicaid Other | Source: Ambulatory Visit | Attending: Podiatry | Admitting: Podiatry

## 2021-05-28 DIAGNOSIS — M79672 Pain in left foot: Secondary | ICD-10-CM

## 2021-05-28 DIAGNOSIS — M76822 Posterior tibial tendinitis, left leg: Secondary | ICD-10-CM

## 2021-05-28 DIAGNOSIS — Q742 Other congenital malformations of lower limb(s), including pelvic girdle: Secondary | ICD-10-CM

## 2021-05-28 MED ORDER — IBUPROFEN 800 MG PO TABS: 800.0000 mg | ORAL_TABLET | Freq: Three times a day (TID) | ORAL | 0 refills | Status: DC | PRN

## 2021-05-28 NOTE — Telephone Encounter (Signed)
Patient calling because she just had her MRI @ [facility] today, requesting something for pain. Please advise.

## 2021-05-29 NOTE — Telephone Encounter (Signed)
Returned call to patient, no answer, could not leave a vmessage, mailbox full. Will try again later.

## 2021-05-30 ENCOUNTER — Telehealth: Payer: Self-pay | Admitting: Physician Assistant

## 2021-05-30 NOTE — Telephone Encounter (Signed)
Pt called in requesting something in writing stating what she is being treated for. She is trying to get back into school. She would like it mailed to her.

## 2021-06-03 ENCOUNTER — Ambulatory Visit (INDEPENDENT_AMBULATORY_CARE_PROVIDER_SITE_OTHER): Payer: Medicaid Other

## 2021-06-03 ENCOUNTER — Ambulatory Visit: Payer: Medicaid Other | Admitting: Podiatry

## 2021-06-03 ENCOUNTER — Other Ambulatory Visit: Payer: Self-pay

## 2021-06-03 DIAGNOSIS — Q742 Other congenital malformations of lower limb(s), including pelvic girdle: Secondary | ICD-10-CM

## 2021-06-03 DIAGNOSIS — M79672 Pain in left foot: Secondary | ICD-10-CM | POA: Diagnosis not present

## 2021-06-03 MED ORDER — IBUPROFEN 800 MG PO TABS: 800.0000 mg | ORAL_TABLET | Freq: Three times a day (TID) | ORAL | 2 refills | Status: AC | PRN

## 2021-06-03 MED ORDER — IBUPROFEN 800 MG PO TABS: 800.0000 mg | ORAL_TABLET | Freq: Three times a day (TID) | ORAL | 3 refills | Status: DC | PRN

## 2021-06-03 NOTE — Progress Notes (Signed)
Subjective:  Patient ID: Jeanne [name], female    DOB: 1991/05/26,  MRN: [id number]  Chief Complaint  Patient presents with   Foot Pain       mri follow up    30 y.o. female presents with the above complaint. History confirmed with patient.  Still has been painful with the boot.  Objective:  Physical Exam: warm, good capillary refill, no trophic changes or ulcerative lesions, normal DP and PT pulses, and normal sensory exam. Left Foot: Prominent navicular bone, she has pain on palpation here and with resisted inversion, she has pes planus deformity Right Foot: Pain on the fifth metatarsal base with pain along the peroneal tendons at the insertion and pain with resisted eversion  No images are attached to the encounter.  Radiographs: Multiple views x-ray of both feet: no fracture, dislocation, swelling or degenerative changes noted and she has prominent navicular tuberosities bilateral the left side shows a lucency within this  Calcaneal axial view taken today shows acceptable hindfoot alignment with the tibia  Study Result  Narrative & Impression  CLINICAL DATA:  Congenital malformation, limb(s). Left ankle pain associated with accessory navicular bone of left foot. Posterior tibial tendonitis.   EXAM: MRI OF THE LEFT ANKLE WITHOUT CONTRAST   TECHNIQUE: Multiplanar, multisequence MR imaging of the ankle was performed. No intravenous contrast was administered.   COMPARISON:  X-ray foot 04/22/2021.   FINDINGS: TENDONS   Peroneal: Peroneus longus and brevis tendons are intact and normally positioned.   Posteromedial: Tibialis posterior, flexor hallucis longus, and flexor digitorum longus tendons are intact and normally positioned. Minimal tendinosis of the distal tibialis posterior tendon. Trace tenosynovial fluid associated with each of the posteromedial ankle tendon sheaths.   Anterior: Tibialis anterior, extensor hallucis longus, and extensor digitorum longus  tendons are intact and normally positioned.   Achilles: Intact.   Plantar Fascia: Intact.   LIGAMENTS   Lateral: Intact tibiofibular ligaments. The anterior and posterior talofibular ligaments are intact. Intact calcaneofibular ligament.   Medial: The deltoid and visualized portions of the spring ligament appear intact.   CARTILAGE AND BONES   Ankle Joint: No significant ankle joint effusion. The talar dome and tibial plafond are intact.   Subtalar Joints/Sinus Tarsi: No cartilage defect. No effusion. Preservation of the anatomic fat within the sinus tarsi.   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 No fluid signal is seen within the synchondrosis. There is bone marrow edema within the third metatarsal shaft with possible nondisplaced incomplete fracture line along the proximal metaphysis (series 7, image 9). Remaining visualized osseous structures are otherwise intact and unremarkable. No malalignment. No suspicious bone lesion.   Other: No significant soft tissue findings.   IMPRESSION: 1. Bone marrow edema within the third metatarsal shaft with possible nondisplaced incomplete fracture line along the proximal metaphysis. Findings may represent a stress fracture. 2. 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 3. Minimal tendinosis of the distal tibialis posterior tendon. Trace tenosynovial fluid associated with each of the posteromedial ankle tendon sheaths.     Electronically Signed   By: Duanne Guess D.O.   On: 05/29/2021 11:39     Assessment:   1. 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  Reviewed my clinical findings and the MRI in detail with  the patient.  Discussed the presence of the accessory navicular bone and the inflammation associated with this.  I recommended continued immobilization in the cam boot and she may begin to transition out of this over the next few weeks into a supportive shoe such as a sneaker with power step orthoses.  These were dispensed today.  Also recommend physical therapy and referral sent to [facility] and she will begin this.  I sent a prescription for ibuprofen 800 mg that she may alternate with Tylenol 1000 mg.  She inquired about a prescription for tramadol but I discussed with her this not an anti-inflammatory and I would not recommend it.  Return in about 8 weeks (around 07/29/2021).

## 2021-06-03 NOTE — Patient Instructions (Signed)
Alternate tylenol 1,000mg  (2 extra strength) with the 800mg  Ibuprofen

## 2021-06-05 ENCOUNTER — Encounter: Payer: Self-pay | Admitting: Neurology

## 2021-06-05 ENCOUNTER — Other Ambulatory Visit: Payer: Self-pay

## 2021-06-05 ENCOUNTER — Ambulatory Visit: Payer: Medicaid Other | Admitting: Neurology

## 2021-06-05 VITALS — BP 174/78 | HR 96 | Ht 66.0 in | Wt 287.6 lb

## 2021-06-05 DIAGNOSIS — G40009 Localization-related (focal) (partial) idiopathic epilepsy and epileptic syndromes with seizures of localized onset, not intractable, without status epilepticus: Secondary | ICD-10-CM

## 2021-06-05 DIAGNOSIS — F32A Depression, unspecified: Secondary | ICD-10-CM | POA: Diagnosis not present

## 2021-06-05 DIAGNOSIS — F419 Anxiety disorder, unspecified: Secondary | ICD-10-CM

## 2021-06-05 MED ORDER — OXCARBAZEPINE 300 MG PO TABS: ORAL_TABLET | ORAL | 11 refills | Status: DC

## 2021-06-05 NOTE — Patient Instructions (Addendum)
This is the contact information for [facility]: [place]: [phone]  2. Referral will be sent to [facility] for psychiatry and therapy  3 Continue oxcarbazepine 300mg : Take 3 tablets twice a day  4. Follow-up in 4 months, call for any changes.   Seizure Precautions: 1. If medication has been prescribed for you to prevent seizures, take it exactly as directed.  Do not stop taking the medicine without talking to your doctor first, even if you have not had a seizure in a long time.   2. Avoid activities in which a seizure would cause danger to yourself or to others.  Don't operate dangerous machinery, swim alone, or climb in high or dangerous places, such as on ladders, roofs, or girders.  Do not drive unless your doctor says you may.  3. If you have any warning that you may have a seizure, lay down in a safe place where you can't hurt yourself.    4.  No driving for 6 months from last seizure, as per [facility].   Please refer to the following link on the Epilepsy [employer] of [employer]'s website for more information: http://www.epilepsyfoundation.org/answerplace/Social/driving/drivingu.cfm   5.  Maintain good sleep hygiene. Avoid alcohol.  6.  Notify your neurology if you are planning pregnancy or if you become pregnant.  7.  Contact your doctor if you have any problems that may be related to the medicine you are taking.  8.  Call 911 and bring the patient back to the ED if:        A.  The seizure lasts longer than 5 minutes.       B.  The patient doesn't awaken shortly after the seizure  C.  The patient has new problems such as difficulty seeing, speaking or moving  D.  The patient was injured during the seizure  E.  The patient has a temperature over 102 F (39C)  F.  The patient vomited and now is having trouble breathing
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
## 2021-06-05 NOTE — Progress Notes (Signed)
Patient was seen, evaluated, and treatment plan was discussed with the Advanced Practice Provider. Last seizure was 05/29/21. She has injured herself with the seizures, she fell on 10/27 and wears a boot. At first she was extra tired on the oxcarbazepine, but now overall tolerates 900mg  BID dose. She denies missing any doses. Main trigger for seizures have been poor sleep. She is feeling very overwhelmed and tired, she would like to return to school but asks for medication to help her focus. She would like to see an ADHD specialist, information provided. She also reports her psychiatrist had started her on Buspar and ?stopped the lorazepam. Discussed that I cannot prescribe lorazepam for her anxiety, she will need to continue follow-up with Behavioral Health, she would like a referral to a different psychiatrist. I have also reviewed the orders written for this patient which were under my direction. I agree with the findings and the plan of care as documented by the Advanced Practice Provider.

## 2021-06-05 NOTE — Telephone Encounter (Signed)
Patient seen 06/05/2021.

## 2021-06-20 ENCOUNTER — Telehealth: Payer: Self-pay | Admitting: Neurology

## 2021-06-20 DIAGNOSIS — F32A Depression, unspecified: Secondary | ICD-10-CM

## 2021-06-20 NOTE — Telephone Encounter (Signed)
Referral was sent in for patients adhd , practice doesn't take her ins. Needs another referral to a new specialist. She said she doesn't know how to go about finding another one.

## 2021-06-20 NOTE — Telephone Encounter (Signed)
Pt called no answer per DPR left a voice mail to call insurance to see who was in network and call back

## 2021-06-23 NOTE — Telephone Encounter (Signed)
Referral placed in epic.

## 2021-06-23 NOTE — Telephone Encounter (Signed)
Patient returned called and said she'd like referred to:  Jolyn Nap, Psychiatrist for ADHD  [phone] or [phone]  Accepting all patients

## 2021-06-25 ENCOUNTER — Other Ambulatory Visit: Payer: Self-pay

## 2021-06-25 DIAGNOSIS — F419 Anxiety disorder, unspecified: Secondary | ICD-10-CM

## 2021-06-26 NOTE — Therapy (Deleted)
OUTPATIENT PHYSICAL THERAPY LOWER EXTREMITY EVALUATION   Patient Name: Jeanne [name] MRN: [id number] DOB:Nov 24, 1990, 31 y.o., female Today's Date: 06/26/2021    Past Medical History:  Diagnosis Date   Asthma    Infection    UTI X1   Infection 06/15/2009   HSV   Infection    CHLAMYDIA  ?   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  ONSET DATE: ***  SUBJECTIVE:                                                                                                                                                                                           Jeanne [name] is a 31 y.o. female who presents to clinic with chief complaint of ***.  MOI/History of condition:  Seizures?  From referring provider:   "Reviewed my clinical findings and the MRI in detail with the patient.  Discussed the presence of the accessory navicular bone and the inflammation associated with this.  I recommended continued immobilization in the cam boot and she may begin to transition out of this over the next few weeks into a supportive shoe such as a sneaker with power step orthoses.  These were dispensed  today.  Also recommend physical therapy and referral sent to [facility] and she will begin this.  I sent a prescription for ibuprofen 800 mg that she may alternate with Tylenol 1000 mg.  She inquired about a prescription for tramadol but I discussed with her this not an anti-inflammatory and I would not recommend it."   Red flags:  {has/denies:26543} {kerredflag:26542}  Pertinent past history:  ***  Pain:  Are you having pain? {yes/no:20286} Pain location: *** NPRS scale:  highest {NUMBERS; 0-10:5044}/10 current {NUMBERS; 0-10:5044}/10  best {NUMBERS; 0-10:5044}/10 Aggravating factors: *** Relieving factors: *** Pain description: {PAIN DESCRIPTION:21022940} Severity: {  Desc; low/moderate/high:110033} Irritability: {Desc; low/moderate/high:110033} Stage: {Desc; acute/subacute/chronic:13799} Stability: {kerbetterworse:26715} 24 hour pattern: ***   Occupation: ***  Hobbies/Recreation: ***  Assistive Device: ***  Hand Dominance: ***  Patient Goals: ***   PRECAUTIONS: {Therapy precautions:24002}  WEIGHT BEARING RESTRICTIONS {Yes ***/No:24003}  FALLS:  Has patient fallen in last 6 months? {yes/no:20286}, Number of falls: ***  LIVING ENVIRONMENT: Lives with: {[facility] lives with:25569::"lives with their family"} Stairs: {yes/no:20286}; {Stairs:24000}  PLOF: {PLOF:24004}  DIAGNOSTIC FINDINGS: ***   OBJECTIVE:    GENERAL OBSERVATION:   ***  SENSATION:  Light touch: {intact/deficits:24005}  PALPATION: ***  MUSCLE LENGTH: Hamstrings: Right *** deg; Left *** deg Thomas test: Right (***); Left (***) Ely's test: Right (***); Left (***) Ober's test: Right (***); Left (***)  LE MMT:  MMT Right 06/26/2021 Left 06/26/2021  Hip flexion (L2, L3) *** ***  Knee extension (L3)    Knee flexion    Hip abduction *** ***  Hip extension *** ***  Hip external rotation    Hip internal rotation    Hip adduction    Ankle dorsiflexion (L4)    Ankle plantarflexion (S1) *** ***   Ankle inversion *** ***  Ankle eversion *** ***  Great Toe ext (L5)     (Blank rows = not tested, score listed is out of 5 possible points.  N = WNL, D = diminished, C = clear for gross weakness with myotome testing, * = concordant pain with testing)  LE ROM:  ROM Right 06/26/2021 Left 06/26/2021  Hip flexion    Hip extension    Hip abduction    Hip adduction    Hip internal rotation    Hip external rotation    Knee flexion    Knee extension    Ankle dorsiflexion (CC) *** ***  Ankle plantarflexion    Ankle inversion    Ankle eversion      (Blank rows = not tested)  LOWER EXTREMITY SPECIAL TESTS:  Knee special tests: {KNEE SPECIAL TESTS:26240}  FUNCTIONAL TESTS:  Progressive balance screen:  Feet together: ***'' Semi Tandem: R in rear ***'', L in rear ***'' Tandem: R in rear ***'', L in rear ***'' SLS: R ***'', L ***''   GAIT: Comments: ***  PATIENT SURVEYS:  {rehab surveys:24030}    TODAY'S TREATMENT: ***   PATIENT EDUCATION:  POC, diagnosis, prognosis, HEP, and outcome measures.  Pt educated via explanation, demonstration, and handout (HEP).  Pt confirms understanding verbally.    HOME EXERCISE PROGRAM: ***  ASSESSMENT:  CLINICAL IMPRESSION: Joice Loftsmber is a 31 y.o. female who presents to clinic with signs and sxs consistent with ***.  Patient presents with pain and impairments/deficits in: ***.  Activity limitations include: ***.  Participation limitations include: ***.  Patient will benefit from skilled therapy to address pain and the listed deficits in order to achieve functional goals, enable safety and independence in completion of daily tasks, and return to PLOF.   REHAB POTENTIAL: {rehabpotential:25112}  CLINICAL DECISION MAKING: {clinical decision making:25114}  EVALUATION COMPLEXITY: {Evaluation complexity:25115}   GOALS:  SHORT TERM GOALS:  STG Name Target Date Goal status  1 Jaquanda will be >75% HEP compliant to improve carryover between  sessions and facilitate independent management of condition  Baseline: No HEP 07/17/2021 INITIAL   LONG TERM GOALS:   LTG Name Target Date Goal status  1 *** 08/21/2021 INITIAL  2 *** 08/21/2021 INITIAL  3 *** 08/21/2021 INITIAL  4 *** 08/21/2021 INITIAL  5 *** 08/21/2021 INITIAL  6 *** 08/21/2021 INITIAL  7 *** 08/21/2021 INITIAL   PLAN: PT FREQUENCY: 1-2x/week  PT DURATION: 8 weeks (Ending 08/21/2021)  PLANNED INTERVENTIONS: Therapeutic exercises, Therapeutic activity, Neuro Muscular re-education, Gait training, Patient/Family education, Joint mobilization, Dry Needling, Electrical stimulation, Spinal mobilization and/or manipulation, Moist heat, Taping, Vasopneumatic device, Ionotophoresis 4mg /ml Dexamethasone, and Manual therapy  PLAN FOR NEXT SESSION: ***   PT, DPT 06/26/2021, 1:29 PM

## 2021-06-27 ENCOUNTER — Ambulatory Visit: Payer: Medicaid Other | Attending: Podiatry | Admitting: Physical Therapy

## 2021-07-03 ENCOUNTER — Telehealth: Payer: Self-pay | Admitting: Neurology

## 2021-07-03 NOTE — Telephone Encounter (Signed)
Patient has several questions about her DX and a referral to a psychiatrist.  She wants to know if there is any blood work or any type of testing for her to take to let her know why she has this DX  Please call

## 2021-07-07 ENCOUNTER — Telehealth: Payer: Self-pay | Admitting: Neurology

## 2021-07-07 ENCOUNTER — Encounter: Payer: Self-pay | Admitting: Neurology

## 2021-07-07 NOTE — Telephone Encounter (Signed)
There is no further test to find out why she started having seizures, her EEG was abnormal showing the increased electricity in her brain, but as to why they started, there is no test to tell us. Any triggers for this current seizure? She has been referred to psychiatry for ADHD screening, wait time may be long. Will do note, thanks

## 2021-07-07 NOTE — Telephone Encounter (Signed)
Patient advised of her EEG results.  Per pt and her Partner wanted to know what could be her triggers. Per Partner patient she is overweight,she smoking more,sleep deprived, depressed.  Please advise. Is there a way to stop them or help them from coming. Should she test for Vitamin defendant.   They are feeling like she wants to have a second opinion. So they have an appt scheduled with [facility]. He feels that the patient deserves someone who is the best.  Someone has to know why she is having these seizures.

## 2021-07-07 NOTE — Telephone Encounter (Signed)
Patient called asking for further testing.  She had an epiletic seizure this morning.  She has been having trouble sleeping.  She wanted to know if she could get a psychiatrist to do an ADHD screening.  She also wanted to know if the Dr could give her a note to give to her teachers asking for more time for her to complete assignments.

## 2021-07-07 NOTE — Telephone Encounter (Signed)
See other phone note

## 2021-07-09 ENCOUNTER — Telehealth: Payer: Self-pay | Admitting: Neurology

## 2021-07-09 MED ORDER — ZONISAMIDE 100 MG PO CAPS: 100.0000 mg | ORAL_CAPSULE | Freq: Every day | ORAL | 1 refills | Status: DC

## 2021-07-09 NOTE — Telephone Encounter (Signed)
Letter mailed to pt.  

## 2021-07-09 NOTE — Telephone Encounter (Signed)
Most common seizure triggers include missing medication, sleep deprivation, alcohol. Stress can also be a trigger in some patients. Would continue working on seeing Psychiatry for the depression and anxiety. The way to stop them from coming is seizure medication. Since she is still having them on the oxcarbazepine, I would add on a second seizure medication called Zonisamide to hopefully quiet down seizures more. Main side effect is drowsiness, sometimes tingling in fingers and toes so don't get scared if she feels that. We always start at a low dose Zonisamide 100mg  qhs. If she agrees, pls send Rx. She is of course welcome to get a second opinion. Thanks

## 2021-07-09 NOTE — Telephone Encounter (Signed)
Pt called in stating she wanted the letter Dr. Delice Lesch wrote on 07/07/21 to be mailed to her att he address in the system. She requested it be signed by Dr. Delice Lesch.
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
## 2021-07-16 ENCOUNTER — Telehealth: Payer: Self-pay | Admitting: Podiatry

## 2021-07-16 NOTE — Telephone Encounter (Signed)
Patient would like a call back from Dr Lilian Kapur or nurse she is having a lot of pain in her foot and she would like someone to call her and discuss the option of surgery.  She wants to know if she proceeds with the surgery will it take away all the pain?  Patient states she is tired of the pain, she cant stand in her kitchen and cook or do anything. She also states that she is putting on weight because she cant get any exercise because of her foot.

## 2021-07-23 ENCOUNTER — Telehealth: Payer: Self-pay

## 2021-07-23 ENCOUNTER — Telehealth: Payer: Self-pay | Admitting: Neurology

## 2021-07-23 NOTE — Telephone Encounter (Signed)
Pt called asking if she was having a stroke because her vision has been getting worse over the past week, no weakness in arms or leg no change in grip, no face droop, no slurred speech, pt is alert and oriented, She stated she has never seen an eye dr. Pt advised that it does not sound like she is having a stroke but a change in vision and that I will send this to Dr Delice Lesch, pt informed that Dr Delice Lesch may advise she sees an eye dr. [name] stated she went on google and it said oxcarbazepine can cause vision chance, but she needs to be on the medication for seizures,

## 2021-07-23 NOTE — Telephone Encounter (Signed)
Pt called no answer per DPR left a voice mail to find an eye DR and see them and to have them fax [place] the results of her visit to [phone]

## 2021-07-23 NOTE — Telephone Encounter (Signed)
Pt called in stating her eye sight has become blurry the last few days. I transferred her to Dr. Rosalyn Gess nurse.

## 2021-07-23 NOTE — Telephone Encounter (Signed)
Yes, she needs to see eye doctor first, thanks

## 2021-07-29 ENCOUNTER — Telehealth: Payer: Self-pay | Admitting: Neurology

## 2021-07-29 ENCOUNTER — Other Ambulatory Visit: Payer: Self-pay

## 2021-07-29 NOTE — Telephone Encounter (Signed)
Patient called and stated that she only has one dose left of her trileptal.  She stated her pharmacy said that the dr has to approve it.  [facility] [place].

## 2021-07-30 ENCOUNTER — Telehealth: Payer: Self-pay

## 2021-07-30 NOTE — Telephone Encounter (Signed)
New message   Your information has been sent to [facility].  Shardai Cheuvront Key: [id number] - [place] Case ID: [id number] Need help? Call us at [phone] Status Sent to [facility] OXcarbazepine 300MG  tablets Form [facility] Electronic Prior Authorization Form (2017 NCPDP)

## 2021-07-30 NOTE — Telephone Encounter (Signed)
Jeanne [name] Key: [id number] - [place] Case ID: [id number] Need help? Call us at [phone] Status Sent to [facility] OXcarbazepine 300MG  tablets Form [facility] Electronic Prior Authorization Form (2017 NCPDP)

## 2021-07-30 NOTE — Telephone Encounter (Signed)
F/u  Receive fax from [facility]   The [place] [facility] Prior Authorization Team is not able to review this request because the requested product does not require prior authorization   Oxcarbazepin 300 mg

## 2021-07-31 ENCOUNTER — Ambulatory Visit: Payer: Medicaid Other | Admitting: Podiatry

## 2021-07-31 NOTE — Telephone Encounter (Signed)
F/u  [employer] Key: [id number] - [place] Case ID: [id number] Need help? Call us at [phone]  Outcome N/Aon February 15  We received a prior authorization request for the member and product listed above. The Community and [facility] Prior Authorization Team is not able to review this request because the requested product does not require prior authorization.Please visit [email or web address] to view our most up-to-date Preferred Drug List (PDL), prior authorization forms, and additional pharmacy resources. ([place] your state - Under Medicaid (Community Plan): [place] and Physician Administered Drugs).  Drug OXcarbazepine 300MG  tablets Form [facility] Electronic Prior Authorization Form (2017 NCPDP)

## 2021-07-31 NOTE — Telephone Encounter (Signed)
Patient called about this matter again.  She is now out of the medication oxcarbazepine 300 MG.  Cc: Jari Sportsman

## 2021-07-31 NOTE — Telephone Encounter (Signed)
F/u  Lakisa Vandenheuvel Key: [id number] - PA Case ID: [id number] help? Call us at [phone]  Outcome N/Aon February 15  We received a prior authorization request for the member and product listed above. The Community and State Medicaid Prior Authorization Team is not able to review this request because the requested product does not require prior authorization.Please visit [email or web address] to view our most up-to-date Preferred Drug List (PDL), prior authorization forms, and additional pharmacy resources. (Select Menu - Health Plans by State - Select your state - Under Medicaid (Community Plan): Select View Offered Plan Information - Pharmacy Resources and Physician Administered Drugs).  Drug OXcarbazepine 300MG tablets Form [facility] Electronic Prior Authorization Form (2017 NCPDP) 

## 2021-07-31 NOTE — Telephone Encounter (Signed)
Pharmacy called to check on pt medication they will have it ready in an hour. Pt called an informed that they are getting it ready.

## 2021-08-12 ENCOUNTER — Telehealth: Payer: Self-pay | Admitting: Neurology

## 2021-08-12 NOTE — Telephone Encounter (Signed)
Patient called about her referral for psychiatry. She has not heard from anyone for scheduling.  Is there a number she can call directly?

## 2021-08-13 NOTE — Telephone Encounter (Signed)
Pt stated that she had an appointmetn scheduled with Dr Johnella Moloney but could not remember the date or time and was asking for that, pt advised we can not see that she would need to call that office. Pt was given the number to the Dr office. ?

## 2021-08-25 ENCOUNTER — Ambulatory Visit (HOSPITAL_COMMUNITY): Payer: Self-pay | Admitting: Licensed Clinical Social Worker

## 2021-08-25 ENCOUNTER — Other Ambulatory Visit: Payer: Self-pay

## 2021-08-26 MED ORDER — OXCARBAZEPINE 300 MG PO TABS: ORAL_TABLET | ORAL | 11 refills | Status: DC

## 2021-09-02 ENCOUNTER — Ambulatory Visit (INDEPENDENT_AMBULATORY_CARE_PROVIDER_SITE_OTHER): Payer: Medicaid Other | Admitting: Psychiatry

## 2021-09-02 ENCOUNTER — Encounter (HOSPITAL_COMMUNITY): Payer: Self-pay | Admitting: Psychiatry

## 2021-09-02 VITALS — BP 128/88 | Temp 97.5°F | Ht 66.0 in | Wt 277.0 lb

## 2021-09-02 DIAGNOSIS — F0630 Mood disorder due to known physiological condition, unspecified: Secondary | ICD-10-CM | POA: Diagnosis not present

## 2021-09-02 DIAGNOSIS — F411 Generalized anxiety disorder: Secondary | ICD-10-CM

## 2021-09-02 DIAGNOSIS — F331 Major depressive disorder, recurrent, moderate: Secondary | ICD-10-CM | POA: Diagnosis not present

## 2021-09-02 MED ORDER — LORAZEPAM 0.5 MG PO TABS: 0.5000 mg | ORAL_TABLET | Freq: Every day | ORAL | 0 refills | Status: DC | PRN
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
## 2021-09-09 ENCOUNTER — Telehealth: Payer: Self-pay | Admitting: Neurology

## 2021-09-09 DIAGNOSIS — Z79899 Other long term (current) drug therapy: Secondary | ICD-10-CM

## 2021-09-09 NOTE — Telephone Encounter (Signed)
Patient called, crying.  She stated that she cant focus and that she is always dizzy. ?

## 2021-09-10 ENCOUNTER — Telehealth: Payer: Self-pay | Admitting: Neurology

## 2021-09-10 DIAGNOSIS — Z79899 Other long term (current) drug therapy: Secondary | ICD-10-CM

## 2021-09-10 NOTE — Telephone Encounter (Signed)
Pls have her do bloodwork for Trileptal level, either do it first thing in the morning before she takes her morning dose, or at the end of the day before lab closes, thanks ?

## 2021-09-10 NOTE — Telephone Encounter (Signed)
Pt called informed have her do bloodwork for Trileptal level, either do it first thing in the morning before she takes her morning dose, or at the end of the day before lab closes ?

## 2021-09-10 NOTE — Telephone Encounter (Signed)
Pt stated that she has a pin an needle feeling all over her body and  that she is hurting all over  ?

## 2021-09-10 NOTE — Telephone Encounter (Signed)
we will also check her thyroid, vitamin D level, and B12 level if this is what is causing the pins and needles. For all over body pain, it is not usually neurological, she will need to speak to her PCP ?

## 2021-09-10 NOTE — Telephone Encounter (Signed)
Patient called back

## 2021-09-10 NOTE — Telephone Encounter (Signed)
Pls let her know we will also check her thyroid, vitamin D level, and B12 level if this is what is causing the pins and needles. For all over body pain, it is not usually neurological, she will need to speak to her PCP. THanks ? ?

## 2021-09-10 NOTE — Addendum Note (Signed)
Addended by: Dimas Chyle on: 09/10/2021 02:15 PM ? ? Modules accepted: Orders ? ?

## 2021-09-10 NOTE — Telephone Encounter (Signed)
The following message was left with [employer] on 09/10/21 at 1:00pm. ? ?Caller states she wants to speak with the office nurse.  ? ?I returned the call but a child picked up and then did not speak. ? ?

## 2021-09-11 ENCOUNTER — Other Ambulatory Visit (INDEPENDENT_AMBULATORY_CARE_PROVIDER_SITE_OTHER): Payer: Medicaid Other

## 2021-09-11 DIAGNOSIS — Z79899 Other long term (current) drug therapy: Secondary | ICD-10-CM

## 2021-09-11 LAB — VITAMIN B12: Vitamin B-12: 807 pg/mL (ref 211–911)

## 2021-09-11 LAB — VITAMIN D 25 HYDROXY (VIT D DEFICIENCY, FRACTURES): VITD: 28.08 ng/mL — ABNORMAL LOW (ref 30.00–100.00)

## 2021-09-12 LAB — THYROID PANEL
Free Thyroxine Index: 1.5 (ref 1.2–4.9)
T3 Uptake Ratio: 25 % (ref 24–39)
T4, Total: 6 ug/dL (ref 4.5–12.0)

## 2021-09-14 LAB — 10-HYDROXYCARBAZEPINE: Triliptal/MTB(Oxcarbazepin): 19.7 ug/mL (ref 8.0–35.0)

## 2021-09-15 MED ORDER — VITAMIN D (ERGOCALCIFEROL) 50000 UNITS PO CAPS: ORAL_CAPSULE | ORAL | 0 refills | Status: DC

## 2021-09-16 ENCOUNTER — Ambulatory Visit (HOSPITAL_COMMUNITY): Payer: Medicaid Other | Admitting: Licensed Clinical Social Worker

## 2021-09-22 ENCOUNTER — Telehealth: Payer: Self-pay | Admitting: Neurology

## 2021-09-22 ENCOUNTER — Other Ambulatory Visit: Payer: Self-pay | Admitting: Neurology

## 2021-09-22 MED ORDER — LACOSAMIDE 50 MG PO TABS: 50.0000 mg | ORAL_TABLET | Freq: Two times a day (BID) | ORAL | 0 refills | Status: DC

## 2021-09-22 NOTE — Progress Notes (Unsigned)
lacos ?

## 2021-09-22 NOTE — Telephone Encounter (Signed)
Pt c/o: seizure ?Missed medications?  no ?Sleep deprived?  no ?Alcohol intake?  no ?Back to their usual baseline self?  Yes ?Meds  trileptal 1800mg  bid. Took an extra dose yesterday and this am.  ? has had 3 seizures this weekend." ?

## 2021-09-22 NOTE — Telephone Encounter (Signed)
Pt's fiance called in stating the patient has been having seizures while on her medication the last 3 days. He says they were small ones. He would like to know what to do? He is worried and can't work due to being afraid to leave her alone. ?

## 2021-09-23 NOTE — Telephone Encounter (Signed)
I advised to patient on Rx' called in. They will pick up Rx.   ?

## 2021-09-30 ENCOUNTER — Ambulatory Visit (INDEPENDENT_AMBULATORY_CARE_PROVIDER_SITE_OTHER): Payer: Medicaid Other | Admitting: Neurology

## 2021-09-30 ENCOUNTER — Encounter: Payer: Self-pay | Admitting: Neurology

## 2021-09-30 VITALS — BP 137/88 | HR 83 | Ht 66.0 in | Wt 283.0 lb

## 2021-09-30 DIAGNOSIS — G40009 Localization-related (focal) (partial) idiopathic epilepsy and epileptic syndromes with seizures of localized onset, not intractable, without status epilepticus: Secondary | ICD-10-CM

## 2021-09-30 MED ORDER — LACOSAMIDE 50 MG PO TABS: 50.0000 mg | ORAL_TABLET | Freq: Two times a day (BID) | ORAL | 5 refills | Status: DC

## 2021-09-30 MED ORDER — CYCLOBENZAPRINE HCL 10 MG PO TABS: 10.0000 mg | ORAL_TABLET | Freq: Three times a day (TID) | ORAL | 11 refills | Status: AC | PRN

## 2021-09-30 MED ORDER — OXCARBAZEPINE 300 MG PO TABS: ORAL_TABLET | ORAL | 11 refills | Status: AC
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
## 2021-10-02 ENCOUNTER — Other Ambulatory Visit: Payer: Self-pay | Admitting: Sports Medicine

## 2021-10-02 ENCOUNTER — Telehealth: Payer: Self-pay | Admitting: *Deleted

## 2021-10-02 ENCOUNTER — Telehealth: Payer: Self-pay | Admitting: Podiatry

## 2021-10-02 MED ORDER — IBUPROFEN 800 MG PO TABS: 800.0000 mg | ORAL_TABLET | Freq: Three times a day (TID) | ORAL | 0 refills | Status: AC | PRN

## 2021-10-02 NOTE — Telephone Encounter (Addendum)
Spoke with the patient and she said that the dog got to her boot, that's why she may need another one.  ?Gave instructions for icing and elevating per Dr Cannon Kettle, verbalized understanding and yes she will need something for pain sent to pharmacy on file. ?

## 2021-10-02 NOTE — Telephone Encounter (Signed)
This was sent to Dr Marylene Land as well, sending in something for pain and patient will wait for upcoming appointment w/ Dr Lilian Kapur on Monday.

## 2021-10-02 NOTE — Telephone Encounter (Signed)
Spoke with patient giving message and she said the the dog got to her boot but will like something called into pharmacy on file for her pain,will discuss surgery with Dr Sherryle Lis on Monday.

## 2021-10-02 NOTE — Telephone Encounter (Signed)
Pt states that she needs another cushion for her boot. The one she has is worn out. Pt has appt Mon 10/06/21 ? ?Please advise. ?

## 2021-10-02 NOTE — Progress Notes (Signed)
Reviewed previous notes defer to Dr. Ralene Cork ?

## 2021-10-06 ENCOUNTER — Telehealth: Payer: Self-pay | Admitting: Podiatry

## 2021-10-06 ENCOUNTER — Ambulatory Visit: Payer: Medicaid Other | Admitting: Podiatry

## 2021-10-06 NOTE — Telephone Encounter (Signed)
Called patient and notified that she can swing by the office and pick up cushioning for boot

## 2021-10-06 NOTE — Telephone Encounter (Signed)
Patient called wants to know if you can help her get the cushioning for her boot?  The boot itself is fine its just the cushioning that is messed up? Please advised

## 2021-10-13 ENCOUNTER — Telehealth: Payer: Self-pay | Admitting: Neurology

## 2021-10-13 NOTE — Telephone Encounter (Signed)
Pt called no answer left a voice mail to call the office back  °

## 2021-10-13 NOTE — Telephone Encounter (Signed)
The following message was left with [facility] on 10/11/21 at /:24 AM. ? ?Caller states she had a seizure. Caller states her seizure was 10 minutes ago. ? ?She states she did not take one of her medications today, Glucosemide ? ?Patient was triaged and given advice for home care. ? ?See detailed report in provider's inbox for more details.  ?

## 2021-10-14 NOTE — Telephone Encounter (Signed)
Pt called no answer left a voice mail to call the office back . Will send to Dr Karel Jarvis and close this call she has been called 3 times with no answer,  ?

## 2021-10-23 ENCOUNTER — Telehealth (HOSPITAL_COMMUNITY): Payer: Medicaid Other | Admitting: Psychiatry

## 2021-10-23 NOTE — Telephone Encounter (Signed)
Patient changed her in office to tele text. But was in car with fiance, she said she cannot talk right now.  She can re schedule and discussed to be on time and in office. Call back for concerns ?

## 2021-10-23 NOTE — Progress Notes (Unsigned)
Patient was in car with friend, wanted to reschedule ?No Show for today ?

## 2021-11-04 ENCOUNTER — Ambulatory Visit (INDEPENDENT_AMBULATORY_CARE_PROVIDER_SITE_OTHER): Payer: Medicaid Other | Admitting: Licensed Clinical Social Worker

## 2021-11-04 DIAGNOSIS — F0630 Mood disorder due to known physiological condition, unspecified: Secondary | ICD-10-CM

## 2021-11-04 DIAGNOSIS — R569 Unspecified convulsions: Secondary | ICD-10-CM

## 2021-11-04 DIAGNOSIS — F411 Generalized anxiety disorder: Secondary | ICD-10-CM | POA: Diagnosis not present

## 2021-11-04 DIAGNOSIS — F332 Major depressive disorder, recurrent severe without psychotic features: Secondary | ICD-10-CM
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
## 2021-11-05 ENCOUNTER — Telehealth (HOSPITAL_COMMUNITY): Payer: Medicaid Other | Admitting: Psychiatry

## 2021-11-06 ENCOUNTER — Telehealth (INDEPENDENT_AMBULATORY_CARE_PROVIDER_SITE_OTHER): Payer: Medicaid Other | Admitting: Psychiatry

## 2021-11-06 ENCOUNTER — Encounter (HOSPITAL_COMMUNITY): Payer: Self-pay | Admitting: Psychiatry

## 2021-11-06 DIAGNOSIS — F411 Generalized anxiety disorder: Secondary | ICD-10-CM | POA: Diagnosis not present

## 2021-11-06 DIAGNOSIS — F332 Major depressive disorder, recurrent severe without psychotic features: Secondary | ICD-10-CM | POA: Diagnosis not present

## 2021-11-06 DIAGNOSIS — F0630 Mood disorder due to known physiological condition, unspecified: Secondary | ICD-10-CM | POA: Diagnosis not present

## 2021-11-06 DIAGNOSIS — Z630 Problems in relationship with spouse or partner: Secondary | ICD-10-CM | POA: Diagnosis not present

## 2021-11-06 MED ORDER — LORAZEPAM 0.5 MG PO TABS: 0.5000 mg | ORAL_TABLET | Freq: Every day | ORAL | 0 refills | Status: DC | PRN
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
## 2021-11-06 NOTE — Addendum Note (Signed)
Addended by: Thresa Ross on: 11/06/2021 03:17 PM   Modules accepted: Orders

## 2021-11-07 ENCOUNTER — Telehealth: Payer: Self-pay | Admitting: Neurology

## 2021-11-07 NOTE — Telephone Encounter (Signed)
LMOVM for patient, Refills sent at her last visit 09/2021. Please advised if we sent to the correct pharmacy [facility] [address]

## 2021-11-07 NOTE — Telephone Encounter (Signed)
2nd attempt at calling patient no answer. Unable to LVM.

## 2021-11-07 NOTE — Telephone Encounter (Signed)
Patient left message with access nurse stating she needs a refill on her medication.

## 2021-11-12 ENCOUNTER — Telehealth: Payer: Self-pay | Admitting: Neurology

## 2021-11-12 NOTE — Telephone Encounter (Signed)
Pt c/o: seizure Missed medications?  No. Sleep deprived?  No. Alcohol intake?  No. Increased stress? No. Any change in medication color or shape? No. Back to their usual baseline self?  Yes . If no, advise go to ER  Per Last ov note:  Current medications prescribed by Dr. Karel Jarvis: Continue Oxcarbazepine 300mg : take 3 tablets twice a day   2. Continue Lacosamide 50mg : take 1 tablet twice a day   I did ask the patient if this the second Seizure she had since her last visit. I see a note from the AN on 10/11/21.  Per Patient No I only had one this one from this morning. I never called to say I had a seizure.  I just wanted to let Dr.Aquino know im taking her advice from my last visit to increase my Lacosamide 50 mg to TID. And If I should continue taking the Vitamin D.   Advised patient I will her know you are increasing your medication,  I can not say yes to the increase because I do not see that in any or note. And per last note: 3. Take the vitamin D capsule once a week for 4 more weeks. Which her last dose was two weeks a go.     Please advise.

## 2021-11-12 NOTE — Telephone Encounter (Signed)
Tried calling patient no answer. Unable to LVM. ?

## 2021-11-12 NOTE — Telephone Encounter (Signed)
Patient wants to let doctor know she had another seizure about an hour ago that lasted 30 seconds. No severe breathing or blueness in her lips. No impact to her head or injury, is alert. She called and LM with AN. I called her back and MB full

## 2021-11-13 ENCOUNTER — Other Ambulatory Visit: Payer: Self-pay | Admitting: Neurology

## 2021-11-13 MED ORDER — LACOSAMIDE 100 MG PO TABS: ORAL_TABLET | ORAL | 5 refills | Status: AC

## 2021-11-13 MED ORDER — LACOSAMIDE 100 MG PO TABS: ORAL_TABLET | ORAL | 5 refills | Status: DC

## 2021-11-13 MED ORDER — VITAMIN D (ERGOCALCIFEROL) 50000 UNITS PO CAPS: ORAL_CAPSULE | ORAL | 0 refills | Status: AC

## 2021-11-13 NOTE — Telephone Encounter (Signed)
Patient is returning a call to Jeanne [name] 

## 2021-11-13 NOTE — Telephone Encounter (Signed)
Pls let her know that I would like to increase the Lacosamide to 100mg  tablet: take 1 tablet twice a day. I will send in the new prescription for the higher dose. Continue oxcarbazepine 300mg  3 tabs BID. Continue vitamin D. Thanks

## 2021-11-13 NOTE — Telephone Encounter (Signed)
Pt called no answer mail box is full  

## 2021-11-13 NOTE — Telephone Encounter (Signed)
Pt called an informed that Dr Karel Jarvis would like to  increase the Lacosamide to 100mg  tablet: take 1 tablet twice a day. I will send in the new prescription for the higher dose. Continue oxcarbazepine 300mg  3 tabs BID. Continue vitamin D.

## 2021-11-13 NOTE — Telephone Encounter (Signed)
Rx sent.t hanks

## 2021-11-18 ENCOUNTER — Telehealth (HOSPITAL_COMMUNITY): Payer: Self-pay

## 2021-11-18 ENCOUNTER — Telehealth (HOSPITAL_COMMUNITY): Payer: Self-pay | Admitting: Licensed Clinical Social Worker

## 2021-11-18 NOTE — Telephone Encounter (Signed)
New message   The patient said she need to speak with you as soon as possible, can not find her normal phone. Did not go into details,

## 2021-11-18 NOTE — Telephone Encounter (Signed)
Called patient back plan for her to go to emergency room see note in chart
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
## 2021-11-19 ENCOUNTER — Ambulatory Visit: Payer: Medicaid Other | Admitting: Neurology

## 2021-11-21 NOTE — Telephone Encounter (Signed)
My chart message sent to pt asking about new number and letting her know that medication is at the pharmacy requested

## 2021-11-21 NOTE — Telephone Encounter (Signed)
Patient called and left a voice mail with a new phone number but I could not make out the specific digits on the recording. I tried to call her back on the number we have but the voice mail box was full.

## 2021-12-09 ENCOUNTER — Telehealth (INDEPENDENT_AMBULATORY_CARE_PROVIDER_SITE_OTHER): Payer: Medicaid Other | Admitting: Psychiatry

## 2021-12-09 ENCOUNTER — Encounter (HOSPITAL_COMMUNITY): Payer: Self-pay | Admitting: Psychiatry

## 2021-12-09 DIAGNOSIS — F411 Generalized anxiety disorder: Secondary | ICD-10-CM | POA: Diagnosis not present

## 2021-12-09 DIAGNOSIS — F332 Major depressive disorder, recurrent severe without psychotic features: Secondary | ICD-10-CM | POA: Diagnosis not present

## 2021-12-09 DIAGNOSIS — F0630 Mood disorder due to known physiological condition, unspecified: Secondary | ICD-10-CM | POA: Diagnosis not present

## 2021-12-09 MED ORDER — LORAZEPAM 0.5 MG PO TABS: 0.5000 mg | ORAL_TABLET | Freq: Every day | ORAL | 0 refills | Status: DC | PRN

## 2021-12-09 MED ORDER — ZIPRASIDONE HCL 80 MG PO CAPS: 80.0000 mg | ORAL_CAPSULE | Freq: Every day | ORAL | 0 refills | Status: DC
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
## 2021-12-10 ENCOUNTER — Ambulatory Visit (HOSPITAL_COMMUNITY): Payer: Medicaid Other | Admitting: Licensed Clinical Social Worker

## 2021-12-10 NOTE — Progress Notes (Signed)
Therapist sent patient text for session and she did not respond. Session is a no show.  Need to finish assessment, pain and nutrition assessment treatment plan, explore more patient's childhood history as needed

## 2021-12-11 ENCOUNTER — Telehealth (HOSPITAL_COMMUNITY): Payer: Self-pay | Admitting: Licensed Clinical Social Worker

## 2021-12-11 NOTE — Telephone Encounter (Signed)
Pt called. She wants me to pass a message onto you. She also wanted me to say this word for word.  "Her opinion formed to me are really reflecting my fellowship with you guys. She will see you on 7/5. Sorry for missing yesterday."

## 2021-12-15 ENCOUNTER — Ambulatory Visit (HOSPITAL_COMMUNITY)
Admission: EM | Admit: 2021-12-15 | Discharge: 2021-12-15 | Disposition: A | Discharge status: Home / Self Care | Payer: Medicaid Other | Attending: Psychiatry | Admitting: Psychiatry

## 2021-12-15 DIAGNOSIS — G40909 Epilepsy, unspecified, not intractable, without status epilepticus: Secondary | ICD-10-CM | POA: Insufficient documentation

## 2021-12-15 DIAGNOSIS — Z630 Problems in relationship with spouse or partner: Secondary | ICD-10-CM | POA: Insufficient documentation

## 2021-12-15 DIAGNOSIS — F331 Major depressive disorder, recurrent, moderate: Secondary | ICD-10-CM | POA: Insufficient documentation

## 2021-12-15 DIAGNOSIS — F4310 Post-traumatic stress disorder, unspecified: Secondary | ICD-10-CM | POA: Insufficient documentation
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
## 2021-12-17 ENCOUNTER — Ambulatory Visit (INDEPENDENT_AMBULATORY_CARE_PROVIDER_SITE_OTHER): Payer: Medicaid Other | Admitting: Licensed Clinical Social Worker

## 2021-12-17 DIAGNOSIS — F411 Generalized anxiety disorder: Secondary | ICD-10-CM

## 2021-12-17 DIAGNOSIS — F0630 Mood disorder due to known physiological condition, unspecified: Secondary | ICD-10-CM

## 2021-12-17 DIAGNOSIS — F331 Major depressive disorder, recurrent, moderate: Secondary | ICD-10-CM

## 2021-12-17 DIAGNOSIS — F332 Major depressive disorder, recurrent severe without psychotic features: Secondary | ICD-10-CM

## 2021-12-17 NOTE — Progress Notes (Addendum)
Therapist contacted patient by text for session and she did not show session is a no show.  This is patient's second no-show in a row will discharge from outpatient

## 2021-12-23 ENCOUNTER — Telehealth: Payer: Self-pay | Admitting: Neurology

## 2021-12-23 NOTE — Telephone Encounter (Signed)
Pt stated that she has canceled her appointments with her psychiatrist she was seeing because she felt like he was just upping and changing her medication and not hearing her. She was asking for a new referral, pt was advised that if she can help find one that will take her insurance we will send the referral in for her,

## 2021-12-23 NOTE — Telephone Encounter (Signed)
Pt called in stating she is wanting to see if she can get a referral to somewhere to get ADHD testing.

## 2021-12-23 NOTE — Telephone Encounter (Signed)
Usually Psychiatry does evaluation for ADHD

## 2021-12-24 ENCOUNTER — Telehealth: Payer: Self-pay | Admitting: Neurology

## 2021-12-24 ENCOUNTER — Encounter (HOSPITAL_COMMUNITY): Payer: Self-pay

## 2021-12-24 ENCOUNTER — Ambulatory Visit (INDEPENDENT_AMBULATORY_CARE_PROVIDER_SITE_OTHER): Payer: Medicaid Other | Admitting: Licensed Clinical Social Worker

## 2021-12-24 DIAGNOSIS — R455 Hostility: Secondary | ICD-10-CM | POA: Diagnosis not present

## 2021-12-24 DIAGNOSIS — F419 Anxiety disorder, unspecified: Secondary | ICD-10-CM

## 2021-12-24 DIAGNOSIS — F411 Generalized anxiety disorder: Secondary | ICD-10-CM

## 2021-12-24 DIAGNOSIS — F332 Major depressive disorder, recurrent severe without psychotic features: Secondary | ICD-10-CM

## 2021-12-24 DIAGNOSIS — F0630 Mood disorder due to known physiological condition, unspecified: Secondary | ICD-10-CM | POA: Diagnosis not present

## 2021-12-24 NOTE — Plan of Care (Signed)
  Problem: Anxiety Disorder CCP Problem  1 decrease Goal:  decrease decrease and work on anger management strategies Outcome: Not Progressing Goal: LTG: Patient will score less than 5 on the Generalized Anxiety Disorder 7 Scale (GAD-7) Outcome: Not Progressing Goal: STG: Report a decrease in anxiety symptoms as evidenced by an overall reduction in anxiety score by a minimum of 25% on the Generalized Anxiety Disorder Scale Outcome: Not Progressing  Patient and therapist worked on treatment plan

## 2021-12-24 NOTE — Addendum Note (Signed)
Addended by: Dimas Chyle on: 12/24/2021 02:59 PM   Modules accepted: Orders

## 2021-12-24 NOTE — Telephone Encounter (Signed)
Referral faxed to [facility]
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
## 2021-12-24 NOTE — Telephone Encounter (Signed)
That is fine, thanks 

## 2021-12-24 NOTE — Telephone Encounter (Signed)
Patient called and said she found an ADHD testing center, [facility] in [place]: [phone].  She'd like a referral to them for testing.

## 2021-12-25 NOTE — Telephone Encounter (Signed)
Touched base yesterday with patient during session.

## 2021-12-26 ENCOUNTER — Telehealth: Payer: Self-pay | Admitting: Neurology

## 2021-12-26 DIAGNOSIS — F419 Anxiety disorder, unspecified: Secondary | ICD-10-CM

## 2021-12-26 DIAGNOSIS — G40009 Localization-related (focal) (partial) idiopathic epilepsy and epileptic syndromes with seizures of localized onset, not intractable, without status epilepticus: Secondary | ICD-10-CM

## 2021-12-26 NOTE — Telephone Encounter (Signed)
Patient is requesting a referral with records for a second opinion to be sent to:  [facility]  [phone], fax  Patient cancelled her follow up on 12/31/21 with Dr. Karel Jarvis.

## 2021-12-26 NOTE — Telephone Encounter (Signed)
Pls send referral asking for second opinion and transfer of care.

## 2021-12-26 NOTE — Telephone Encounter (Signed)
Referral sent to [facility]  [phone] via fax for pt  for transfer of care and second opinion

## 2021-12-26 NOTE — Addendum Note (Signed)
Addended by: Dimas Chyle on: 12/26/2021 10:45 AM   Modules accepted: Orders

## 2021-12-31 ENCOUNTER — Ambulatory Visit: Payer: Medicaid Other | Admitting: Neurology

## 2022-01-02 ENCOUNTER — Encounter: Payer: Self-pay | Admitting: Neurology

## 2022-01-05 ENCOUNTER — Telehealth (HOSPITAL_COMMUNITY): Payer: Self-pay | Admitting: Internal Medicine

## 2022-01-05 NOTE — BH Assessment (Signed)
Care Management - [facility] Follow Up Discharges   Writer attempted to make contact with patient today and was unsuccessful.  Writer left a HIPPA compliant voice message.   Per chart review, patient will follow up with established provider Dr. Gilmore Laroche for medication management and therapy in place with Coolidge Breeze, LCSW.

## 2022-01-13 ENCOUNTER — Telehealth: Payer: Self-pay | Admitting: *Deleted

## 2022-01-13 ENCOUNTER — Telehealth: Payer: Self-pay | Admitting: Podiatry

## 2022-01-13 NOTE — Telephone Encounter (Signed)
Called patient and explained that she will not be able to get a cushion w/o purchasing an entire new boot,(per Tonika) , she verbalized understanding and would like to request another prescription for [facility] which is were she got her last one in 2022. Please advise.

## 2022-01-13 NOTE — Telephone Encounter (Signed)
Pt states that she need a new boot cushion for her fractured ankle. She would need to know where to come to pick this up.  Please advise

## 2022-01-13 NOTE — Telephone Encounter (Signed)
Patient is asking if we can give her another cushion that fits inside the boot, dog tore up the one she had, please advise.

## 2022-01-14 ENCOUNTER — Other Ambulatory Visit: Payer: Self-pay | Admitting: Neurology

## 2022-01-14 ENCOUNTER — Telehealth (HOSPITAL_COMMUNITY): Payer: Self-pay

## 2022-01-14 NOTE — Telephone Encounter (Signed)
She is now requesting a script to [facility] , which is were she received her last boot.

## 2022-01-14 NOTE — Telephone Encounter (Signed)
Patient wants to know if you would refill her Ativan. She says she is looking for a new provider but her meds are running out. [facility] [address] [facility] [place]

## 2022-01-15 ENCOUNTER — Other Ambulatory Visit: Payer: Self-pay | Admitting: Neurology

## 2022-01-15 ENCOUNTER — Telehealth: Payer: Self-pay | Admitting: Neurology

## 2022-01-15 MED ORDER — LORAZEPAM 0.5 MG PO TABS: 0.5000 mg | ORAL_TABLET | Freq: Every day | ORAL | 0 refills | Status: DC | PRN

## 2022-01-15 NOTE — Telephone Encounter (Signed)
Patient dismissed from [facility] by Patrcia Dolly, MD, effective 01/02/22  K[facility] . Dismissal Letter sent out by 1st class mail. [facility]

## 2022-01-15 NOTE — Telephone Encounter (Signed)
Patient notified, prescription sent to [facility]-01/15/22

## 2022-01-16 ENCOUNTER — Telehealth (HOSPITAL_COMMUNITY): Payer: Medicaid Other | Admitting: Psychiatry

## 2022-01-16 ENCOUNTER — Other Ambulatory Visit: Payer: Self-pay | Admitting: General Surgery

## 2022-01-16 DIAGNOSIS — K439 Ventral hernia without obstruction or gangrene: Secondary | ICD-10-CM

## 2022-01-22 ENCOUNTER — Ambulatory Visit (INDEPENDENT_AMBULATORY_CARE_PROVIDER_SITE_OTHER): Payer: Medicaid Other | Admitting: Licensed Clinical Social Worker

## 2022-01-22 DIAGNOSIS — F411 Generalized anxiety disorder: Secondary | ICD-10-CM

## 2022-01-22 DIAGNOSIS — F331 Major depressive disorder, recurrent, moderate: Secondary | ICD-10-CM | POA: Diagnosis not present

## 2022-01-22 DIAGNOSIS — F332 Major depressive disorder, recurrent severe without psychotic features: Secondary | ICD-10-CM

## 2022-01-22 DIAGNOSIS — F0630 Mood disorder due to known physiological condition, unspecified: Secondary | ICD-10-CM

## 2022-01-22 NOTE — Progress Notes (Addendum)
Virtual Visit via Video Note  I connected with Jeanne [name] on 01/22/22 at  4:00 PM EDT by a video enabled telemedicine application and verified that I am speaking with the correct person using two identifiers.  Location: Patient: home Provider: home office   I discussed the limitations of evaluation and management by telemedicine and the availability of in person appointments. The patient expressed understanding and agreed to proceed.   I discussed the assessment and treatment plan with the patient. The patient was provided an opportunity to ask questions and all were answered. The patient agreed with the plan and demonstrated an understanding of the instructions.   The patient was advised to call back or seek an in-person evaluation if the symptoms worsen or if the condition fails to improve as anticipated.  I provided 53 minutes of non-face-to-face time during this encounter.  THERAPIST PROGRESS NOTE  Session Time: 4:00 PM to 4:53 PM  Participation Level: Active  Behavioral Response: CasualAlertAnxious  Type of Therapy: Individual Therapy  Treatment Goals addressed: Addressed anxiety, depression, anger, coping  ProgressTowards Goals: Progressing-discussed time management as a coping strategy for stress, managing negative environment through different strategies such as exercise setting boundaries working with patient with mental model that helps also with her stressors and mental health  Interventions: CBT, Solution Focused, Strength-based, Supportive, and Other: coping  Summary: Jeanne [name] is a 31 y.o. female who presents with doing ok cheated on therapist but found out not for her. Went to [facility] medication management. A place for rehab center. Talking to sister about it yesterday. When think she is doing ok then notice one area in life crumbling know when something wrong. Think everything fine and then bathroom was a mess. Not attending to things need to attend  ignoring for awhile.  Therapist explored underlying causes depression other stressors patient says there is more more than that rather not say. Not attend to things need to attend to. Kitchen cleaned than everything good did her duty. Issue becomes everything else besides the kitchen.  Worked with other therapist on time management.  Good schedule and not following. Recently taking meds supposed to take.  Reviewed a strategy for time management including things that are urgent and important.  Patient shared her list. 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 Put a lot into cleaning the house a big stress always cleaning something feels still not getting anything done. Patient puts on her list need to do important but not urgent. Put at number 3.  Therapist explained the specific test she will do the ones where her priorities are first patient identified more specifically important and urgent includes girl's hair, doctor's appointment for them.For herself exercise lose weight.  Patient explained with previous therapist scheduled hour by hour what should be doing. For patient important for her to keep appointments with health. Shared talks to her sister from two homes but experiencing the same thing. A similar experience they patient has that patient describes has a demon in life felt it rising called the police and told her to pick her up. Dealing with  children's father. Sister said going to her mother so bash her head on floor and patient says see how bashing in head or wall a way out but have to do it right or mess up face. Patient realizes for herself is to remove herself from situation. Not easy way more a long-term solution.  At this point deal with it feels ice in chest from the anxiety.  Shared how it feels the feeling when go on roller coaster initial drop that is what feels. Doesn't think medication will fix it. Avoided anti-anxiety  take as needed want to make sure have some. Called police patient says like jail helped with medication. Makes her sleepy don't take as should not the way prescribed.  Cymbalta just started taking the way supposed to in the morning. Everyday either crying from anxiety or at the initial drop of the roller coaster. Took a nap in the state.  Children's father is the trigger what makes him trigger will be something he says like if she was watching the kids them wouldn't happen. Constant criticizing all negative constant no fun being around negative no support no true love. Find a way out.  Explored with therapist ways to manage this negative environment.  Patient says exercise on list can get a walk in.  Went on to describe other scenarios that are negative [place] can you help with business he will say make sure this and that is done. He will strong arm to make sure help with business. Doesn't help with hers.  Discussed setting boundaries there.  Gust helpful to work on self-esteem patient says exercise will help have to lose this weight. Biggest have big. Usually walk in the morning cardio in morning.  Reviewed what she got from session and she said she got her priorities in order learned about new book into detail about her priorities.        Work with patient on strategies to help with anxiety and stress introduced time management strategy where she is going to label activities as urgent, meaning she has to get them done right away important meeting these going to further her life goals and needs to include them in her schedule even if not right away.  Patient able to establish priorities therapist said keeping in mind doing more important tasks which she identified including her girls, herself housecleaning etc. noted importance in our priorities of establishing wellness goals and they are as important as the stressful things we have to take care of.  Discussed and working with mental health where we have the most  control is in our choices and mental models what we work within therapy.  Noted patient's symptom of anxiety as significant provided positive feedback she started medications is that we will help as we work on coping with anxiety.  Identified trigger of kids father that he is very negative so we looked at strategies to help her manage this negative environment with long-term goal of leaving.  We identified exercise is helpful for mood also self-esteem fitting in with her priority of taking care of herself so this has to be regular activity.  Therapist said set boundaries can help the situation working on self-esteem working on her own sense of strengths and resilience helps.  Therapist also noted having her schedule ways to get reprieve that boost her spirit and energy help her with the negativity.  Working on herself will help with negativity.  Therapist provided space and support for patient to talk about thoughts and feelings in session Suicidal/Homicidal: No  Plan: Return again in 2 weeks.2.  Continue to look at book stop overthinking for help in managing anxiety, work on depression, anger, cope  Diagnosis: Mood disorder in conditions classified elsewhere, generalized anxiety disorder, major depressive disorder, recurrent, severe  Collaboration of Care: Other none needed  Patient/Guardian was advised Release of Information must be obtained prior to any record release in order to collaborate their care with an outside provider. Patient/Guardian was advised if they have not already done so to contact the registration department to sign all necessary forms in order for [place] to release information regarding their care.   Consent: Patient/Guardian gives verbal consent for treatment and assignment of benefits for services provided during this visit. Patient/Guardian expressed understanding and agreed to proceed.   Coolidge Breeze, LCSW 01/22/2022

## 2022-01-28 ENCOUNTER — Telehealth (HOSPITAL_COMMUNITY): Payer: Self-pay | Admitting: Psychiatry

## 2022-01-28 MED ORDER — LORAZEPAM 0.5 MG PO TABS: 0.5000 mg | ORAL_TABLET | Freq: Every day | ORAL | 0 refills | Status: DC | PRN

## 2022-01-28 NOTE — Telephone Encounter (Signed)
Patient called requesting an alternative medication to Lorazepam 0.5 mg be sent to pharmacy due to this being out of stock and on shortage. Confirmed with pharmacy that this is accurate and that latest order from 01/15/2022 had not been filled.Pharmacy stated that both 0.5 mg and 1 mg lorazepam are on shortage.  [id number] [place], [place] - [address] (Ph: [phone])   Last visit:  12/09/2021  Next visit:  None scheduled - stated she is switching providers.

## 2022-01-28 NOTE — Telephone Encounter (Signed)
Medication management - called patient back to inform Dr. Gilmore Laroche would prefer patient contact other local pharmacies to her to see if they have Lorazepam 0.5 mg in stock and then he would send a new order to them. Patient agreed with plan as Dr. Gilmore Laroche did not want to change patient's medication or dosage if not necessary.

## 2022-01-29 MED ORDER — LORAZEPAM 0.5 MG PO TABS: 0.5000 mg | ORAL_TABLET | Freq: Every day | ORAL | 0 refills | Status: AC | PRN

## 2022-01-29 NOTE — Addendum Note (Signed)
Addended by: Thresa Ross on: 01/29/2022 08:51 AM   Modules accepted: Orders

## 2022-01-30 ENCOUNTER — Telehealth: Payer: Self-pay | Admitting: *Deleted

## 2022-01-30 MED ORDER — TRAMADOL HCL 50 MG PO TABS: 50.0000 mg | ORAL_TABLET | Freq: Three times a day (TID) | ORAL | 0 refills | Status: AC | PRN

## 2022-01-30 NOTE — Telephone Encounter (Signed)
Patient is calling to request something stronger for pain, meloxicam is no longer helping. Pain was so bad last night, could not sleep. She has an upcoming appointment 09/11.

## 2022-02-01 ENCOUNTER — Other Ambulatory Visit: Payer: Self-pay | Admitting: Neurology

## 2022-02-03 ENCOUNTER — Ambulatory Visit (INDEPENDENT_AMBULATORY_CARE_PROVIDER_SITE_OTHER): Payer: Medicaid Other | Admitting: Podiatry

## 2022-02-03 ENCOUNTER — Ambulatory Visit (INDEPENDENT_AMBULATORY_CARE_PROVIDER_SITE_OTHER): Payer: Medicaid Other

## 2022-02-03 DIAGNOSIS — M79672 Pain in left foot: Secondary | ICD-10-CM

## 2022-02-03 DIAGNOSIS — Q742 Other congenital malformations of lower limb(s), including pelvic girdle: Secondary | ICD-10-CM | POA: Diagnosis not present

## 2022-02-03 DIAGNOSIS — M76822 Posterior tibial tendinitis, left leg: Secondary | ICD-10-CM

## 2022-02-03 DIAGNOSIS — M216X2 Other acquired deformities of left foot: Secondary | ICD-10-CM | POA: Diagnosis not present

## 2022-02-03 MED ORDER — DICLOFENAC SODIUM 75 MG PO TBEC: 75.0000 mg | DELAYED_RELEASE_TABLET | Freq: Two times a day (BID) | ORAL | 2 refills | Status: AC

## 2022-02-03 MED ORDER — METHYLPREDNISOLONE 4 MG PO TBPK: ORAL_TABLET | ORAL | 0 refills | Status: AC

## 2022-02-03 MED ORDER — DICLOFENAC SODIUM 1 % EX GEL: 4.0000 g | Freq: Four times a day (QID) | CUTANEOUS | 4 refills | Status: AC

## 2022-02-03 NOTE — Progress Notes (Signed)
Subjective:  Patient ID: Jeanne [name], female    DOB: May 20, 1991,  MRN: [id number]  Chief Complaint  Patient presents with   Ankle Pain    Left ankle follow up, new xrays today, pain is not any better    31 y.o. female presents with the above complaint. History confirmed with patient.  Started to get better around March but she has not been able to get out of the boot but still very painful for her  Objective:  Physical Exam: warm, good capillary refill, no trophic changes or ulcerative lesions, normal DP and PT pulses, and normal sensory exam. Left Foot: Prominent navicular bone, she has pain on palpation here and with resisted inversion, she has pes planus deformity  No images are attached to the encounter.  Radiographs: New films taken today of the left ankle show no acute osseous abnormalities persistent accessory navicular bone  Calcaneal axial view taken today shows acceptable hindfoot alignment with the tibia  Study Result  Narrative & Impression  CLINICAL DATA:  Congenital malformation, limb(s). Left ankle pain associated with accessory navicular bone of left foot. Posterior tibial tendonitis.   EXAM: MRI OF THE LEFT ANKLE WITHOUT CONTRAST   TECHNIQUE: Multiplanar, multisequence MR imaging of the ankle was performed. No intravenous contrast was administered.   COMPARISON:  X-ray foot 04/22/2021.   FINDINGS: TENDONS   Peroneal: Peroneus longus and brevis tendons are intact and normally positioned.   Posteromedial: Tibialis posterior, flexor hallucis longus, and flexor digitorum longus tendons are intact and normally positioned. Minimal tendinosis of the distal tibialis posterior tendon. Trace tenosynovial fluid associated with each of the posteromedial ankle tendon sheaths.   Anterior: Tibialis anterior, extensor hallucis longus, and extensor digitorum longus tendons are intact and normally positioned.   Achilles: Intact.   Plantar Fascia: Intact.    LIGAMENTS   Lateral: Intact tibiofibular ligaments. The anterior and posterior talofibular ligaments are intact. Intact calcaneofibular ligament.   Medial: The deltoid and visualized portions of the spring ligament appear intact.   CARTILAGE AND BONES   Ankle Joint: No significant ankle joint effusion. The talar dome and tibial plafond are intact.   Subtalar Joints/Sinus Tarsi: No cartilage defect. No effusion. Preservation of the anatomic fat within the sinus tarsi.   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 No fluid signal is seen within the synchondrosis. There is bone marrow edema within the third metatarsal shaft with possible nondisplaced incomplete fracture line along the proximal metaphysis (series 7, image 9). Remaining visualized osseous structures are otherwise intact and unremarkable. No malalignment. No suspicious bone lesion.   Other: No significant soft tissue findings.   IMPRESSION: 1. Bone marrow edema within the third metatarsal shaft with possible nondisplaced incomplete fracture line along the proximal metaphysis. Findings may represent a stress fracture. 2. 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 3. Minimal tendinosis of the distal tibialis posterior tendon. Trace tenosynovial fluid associated with each of the posteromedial ankle tendon sheaths.     Electronically Signed   By: Duanne Guess D.O.   On: 05/29/2021 11:39     Assessment:   1. 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 Posterior tibial tendinitis of left lower extremity      Plan:  Patient was evaluated and  treated and all questions answered.  Continues to fairly significant pain along the posterior tibial tendon associated with the  accessory navicular bone she has.  We again discussed surgical and nonsurgical treatment options.  I do think she would likely benefit from surgical treatment of this and we discussed the risk benefits and potential complications as well as the postoperative course for such procedure.  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  Additionally she also has had issues recently with her overall wellbeing and mental health and is in treatment for this.  I recommend continuing physical therapy for now and a referral was sent for this again, she did not do this after last visit when I saw her.  I prescribed her a methylprednisolone taper to be followed by diclofenac p.o. twice daily, also recommended Voltaren gel to use topically.  She will return to see me in 2 months for follow-up after physical therapy.  Currently I think she is likely a poor candidate for being able to recover from surgery effectively and safely   Return in about 2 months (around 04/05/2022) for follow up on tendinitis, painful bone after PT.

## 2022-02-03 NOTE — Patient Instructions (Signed)
Call to schedule physical therapy: [facility] at [place] [address]  [phone]  

## 2022-02-04 ENCOUNTER — Telehealth: Payer: Self-pay

## 2022-02-04 NOTE — Telephone Encounter (Signed)
traMADol (ULTRAM) 50 MG tablet   Prior authorization was started today on 02/04/22

## 2022-02-05 ENCOUNTER — Ambulatory Visit (HOSPITAL_COMMUNITY): Payer: Medicaid Other | Admitting: Licensed Clinical Social Worker

## 2022-02-09 ENCOUNTER — Encounter (HOSPITAL_COMMUNITY): Payer: Self-pay | Admitting: Emergency Medicine

## 2022-02-09 ENCOUNTER — Emergency Department (HOSPITAL_COMMUNITY)
Admission: EM | Admit: 2022-02-09 | Discharge: 2022-02-09 | Discharge status: Against Medical Advice | Payer: Medicaid Other | Attending: Emergency Medicine | Admitting: Emergency Medicine

## 2022-02-09 DIAGNOSIS — R42 Dizziness and giddiness: Secondary | ICD-10-CM | POA: Diagnosis present

## 2022-02-09 DIAGNOSIS — Z5321 Procedure and treatment not carried out due to patient leaving prior to being seen by health care provider: Secondary | ICD-10-CM | POA: Diagnosis not present

## 2022-02-09 DIAGNOSIS — R569 Unspecified convulsions: Secondary | ICD-10-CM | POA: Insufficient documentation

## 2022-02-09 NOTE — ED Triage Notes (Signed)
Pt reports that she had a seizure 4 days ago (states that she was passed out all of Thursday). States that she has been dizzy and not feeling like herself since.

## 2022-02-09 NOTE — ED Notes (Signed)
Per Registration, this patient states she was leaving and left at 2039

## 2022-02-12 ENCOUNTER — Ambulatory Visit (HOSPITAL_COMMUNITY): Payer: Medicaid Other | Admitting: Psychiatry

## 2022-02-13 ENCOUNTER — Ambulatory Visit
Admission: RE | Admit: 2022-02-13 | Discharge: 2022-02-13 | Disposition: A | Discharge status: Home / Self Care | Payer: Medicaid Other | Source: Ambulatory Visit | Attending: General Surgery | Admitting: General Surgery

## 2022-02-13 DIAGNOSIS — K439 Ventral hernia without obstruction or gangrene: Secondary | ICD-10-CM

## 2022-02-23 ENCOUNTER — Ambulatory Visit: Payer: Medicaid Other | Admitting: Podiatry

## 2022-03-02 ENCOUNTER — Telehealth (HOSPITAL_COMMUNITY): Payer: Self-pay

## 2022-03-02 ENCOUNTER — Other Ambulatory Visit (HOSPITAL_COMMUNITY): Payer: Self-pay | Admitting: Psychiatry

## 2022-03-02 NOTE — Telephone Encounter (Signed)
Patient called wanting refills on meds. She does not want to see Dr. Olena Heckle any longer. I told her Dr. De Nurse is out of the office on vacation. I told her that we need an appt on the books to get an on call doctor to refill the meds because we can't just keep refilling meds without an appt I told her to reach out to her PCP to see if they would be willing to fill it and she says that we were the last to refill and wants [place] to do it. She put a man on the phone and he was very combative and disrespectful with the cursing and arguing. I tried to calm him down and explain what was going on and he kept on arguing. He kept telling me goodbye but wanted to still argue, so I hung up because he was very disrespectful.

## 2022-03-04 ENCOUNTER — Telehealth (HOSPITAL_COMMUNITY): Payer: Self-pay

## 2022-03-04 NOTE — Telephone Encounter (Signed)
Medication management - Telephone call with Malachi Paradise, pharmacist at [facility] [address] to inform patient's Ziprasidone 80 mg capsules were approved this date until 02/27/23, PA#[id number].  Collateral verified the medication was now able to be filled as written.

## 2022-03-17 ENCOUNTER — Other Ambulatory Visit: Payer: Self-pay | Admitting: Neurology

## 2022-03-19 ENCOUNTER — Ambulatory Visit (HOSPITAL_COMMUNITY): Payer: Medicaid Other | Admitting: Licensed Clinical Social Worker

## 2022-03-23 ENCOUNTER — Telehealth: Payer: Self-pay

## 2022-03-23 NOTE — Telephone Encounter (Signed)
Left a voicemail for the patient to return my call. 

## 2022-03-24 ENCOUNTER — Encounter (HOSPITAL_COMMUNITY): Payer: Self-pay

## 2022-03-24 ENCOUNTER — Other Ambulatory Visit: Payer: Self-pay

## 2022-03-24 ENCOUNTER — Encounter (HOSPITAL_COMMUNITY): Payer: Self-pay | Admitting: Licensed Clinical Social Worker

## 2022-03-24 ENCOUNTER — Emergency Department (HOSPITAL_COMMUNITY)
Admission: EM | Admit: 2022-03-24 | Discharge: 2022-03-25 | Disposition: A | Discharge status: Other Acute Inpatient Hospital | Payer: Medicaid Other | Attending: Emergency Medicine | Admitting: Emergency Medicine

## 2022-03-24 ENCOUNTER — Encounter (HOSPITAL_COMMUNITY): Payer: Self-pay | Admitting: Emergency Medicine

## 2022-03-24 ENCOUNTER — Telehealth: Payer: Self-pay | Admitting: *Deleted

## 2022-03-24 ENCOUNTER — Ambulatory Visit (HOSPITAL_COMMUNITY): Payer: Medicaid Other | Admitting: Licensed Clinical Social Worker

## 2022-03-24 DIAGNOSIS — F17210 Nicotine dependence, cigarettes, uncomplicated: Secondary | ICD-10-CM | POA: Insufficient documentation

## 2022-03-24 DIAGNOSIS — R030 Elevated blood-pressure reading, without diagnosis of hypertension: Secondary | ICD-10-CM | POA: Insufficient documentation

## 2022-03-24 DIAGNOSIS — Z1152 Encounter for screening for COVID-19: Secondary | ICD-10-CM | POA: Insufficient documentation

## 2022-03-24 DIAGNOSIS — F439 Reaction to severe stress, unspecified: Secondary | ICD-10-CM | POA: Diagnosis present

## 2022-03-24 DIAGNOSIS — R45851 Suicidal ideations: Secondary | ICD-10-CM | POA: Insufficient documentation

## 2022-03-24 DIAGNOSIS — F332 Major depressive disorder, recurrent severe without psychotic features: Secondary | ICD-10-CM | POA: Diagnosis not present

## 2022-03-24 DIAGNOSIS — J45909 Unspecified asthma, uncomplicated: Secondary | ICD-10-CM | POA: Diagnosis not present

## 2022-03-24 LAB — CBC WITH DIFFERENTIAL/PLATELET
Abs Immature Granulocytes: 0.02 10*3/uL (ref 0.00–0.07)
Basophils Absolute: 0.1 10*3/uL (ref 0.0–0.1)
Basophils Relative: 1 %
Eosinophils Absolute: 0.4 10*3/uL (ref 0.0–0.5)
Eosinophils Relative: 5 %
HCT: 40.4 % (ref 36.0–46.0)
Hemoglobin: 13.2 g/dL (ref 12.0–15.0)
Immature Granulocytes: 0 %
Lymphocytes Relative: 44 %
Lymphs Abs: 3.3 10*3/uL (ref 0.7–4.0)
MCH: 29.7 pg (ref 26.0–34.0)
MCHC: 32.7 g/dL (ref 30.0–36.0)
MCV: 91 fL (ref 80.0–100.0)
Monocytes Absolute: 0.6 10*3/uL (ref 0.1–1.0)
Monocytes Relative: 8 %
Neutro Abs: 3.1 10*3/uL (ref 1.7–7.7)
Neutrophils Relative %: 42 %
Platelets: 234 10*3/uL (ref 150–400)
RBC: 4.44 MIL/uL (ref 3.87–5.11)
RDW: 14.6 % (ref 11.5–15.5)
WBC: 7.4 10*3/uL (ref 4.0–10.5)
nRBC: 0 % (ref 0.0–0.2)

## 2022-03-24 LAB — COMPREHENSIVE METABOLIC PANEL
ALT: 35 U/L (ref 0–44)
AST: 26 U/L (ref 15–41)
Albumin: 4.3 g/dL (ref 3.5–5.0)
Alkaline Phosphatase: 53 U/L (ref 38–126)
Anion gap: 8 (ref 5–15)
BUN: 15 mg/dL (ref 6–20)
CO2: 24 mmol/L (ref 22–32)
Calcium: 9.5 mg/dL (ref 8.9–10.3)
Chloride: 103 mmol/L (ref 98–111)
Creatinine, Ser: 0.88 mg/dL (ref 0.44–1.00)
GFR, Estimated: >60 mL/min (ref >60)
Glucose, Bld: 100 mg/dL — ABNORMAL HIGH (ref 70–99)
Potassium: 4 mmol/L (ref 3.5–5.1)
Sodium: 135 mmol/L (ref 135–145)
Total Bilirubin: 0.3 mg/dL (ref 0.3–1.2)
Total Protein: 7.9 g/dL (ref 6.5–8.1)

## 2022-03-24 LAB — ETHANOL: Alcohol, Ethyl (B): <10 mg/dL (ref <10)

## 2022-03-24 LAB — I-STAT BETA HCG BLOOD, ED (MC, WL, AP ONLY): I-stat hCG, quantitative: <5 m[IU]/mL (ref <5)

## 2022-03-24 MED ORDER — ZIPRASIDONE HCL 20 MG PO CAPS
60.0000 mg | ORAL_CAPSULE | Freq: Every day | ORAL | Status: DC
  Administered 2022-03-25: 60 mg via ORAL
  Filled 2022-03-24: qty 3

## 2022-03-24 MED ORDER — OXCARBAZEPINE 300 MG PO TABS
900.0000 mg | ORAL_TABLET | Freq: Two times a day (BID) | ORAL | Status: DC
  Administered 2022-03-24 – 2022-03-25 (×2): 900 mg via ORAL
  Filled 2022-03-24 (×2): qty 3

## 2022-03-24 MED ORDER — LACOSAMIDE 50 MG PO TABS
200.0000 mg | ORAL_TABLET | Freq: Two times a day (BID) | ORAL | Status: DC
  Administered 2022-03-24 – 2022-03-25 (×2): 200 mg via ORAL
  Filled 2022-03-24 (×2): qty 4

## 2022-03-24 MED ORDER — LORAZEPAM 0.5 MG PO TABS
0.5000 mg | ORAL_TABLET | ORAL | Status: DC | PRN
  Administered 2022-03-24 – 2022-03-25 (×3): 0.5 mg via ORAL
  Filled 2022-03-24 (×3): qty 1

## 2022-03-24 MED ORDER — IBUPROFEN 200 MG PO TABS
600.0000 mg | ORAL_TABLET | Freq: Four times a day (QID) | ORAL | Status: DC | PRN
  Administered 2022-03-25: 600 mg via ORAL
  Filled 2022-03-24: qty 3

## 2022-03-24 MED ORDER — ZIPRASIDONE HCL 20 MG PO CAPS: 80.0000 mg | ORAL_CAPSULE | Freq: Every day | ORAL | Status: DC

## 2022-03-24 MED ORDER — ZIPRASIDONE MESYLATE 20 MG IM SOLR
20.0000 mg | Freq: Once | INTRAMUSCULAR | Status: AC
  Administered 2022-03-24: 20 mg via INTRAMUSCULAR
  Filled 2022-03-24: qty 20

## 2022-03-24 MED ORDER — STERILE WATER FOR INJECTION IJ SOLN
INTRAMUSCULAR | Status: AC
  Filled 2022-03-24: qty 10

## 2022-03-24 MED ORDER — LACOSAMIDE 50 MG PO TABS: 200.0000 mg | ORAL_TABLET | Freq: Two times a day (BID) | ORAL | Status: DC
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
## 2022-03-24 NOTE — Telephone Encounter (Signed)
Patient calling back concerning physician's message that a prescription has been sent into pharmacy for Voltaren, verbalized understanding ,said thank you.
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
## 2022-03-24 NOTE — Progress Notes (Signed)
Therapist contacted patient by text for session and she did not respond. Session is a no show 

## 2022-03-24 NOTE — ED Notes (Signed)
Security and [employer]  bedside. MD gave orders for restraints. Restraints applied and medication administered.

## 2022-03-24 NOTE — ED Notes (Signed)
MHT offered dinner tray, but refused it. Jeanne [name] stated she was upset and did not want to eat. MHT informed the tray would be saved for her if she changed her mind.

## 2022-03-24 NOTE — ED Notes (Signed)
Social Work at bedside 

## 2022-03-24 NOTE — ED Notes (Signed)
PA at bedside.

## 2022-03-24 NOTE — ED Notes (Signed)
Patient snapped about the sized of the urine cup. She became extremely agitated. Yelling at the nurse, and coming to the desk and screaming at the nurse. Patient approached the desk in a threatening manner. Patient started clinching her fist and getting louder while getting closer.

## 2022-03-25 ENCOUNTER — Encounter (HOSPITAL_COMMUNITY): Payer: Self-pay

## 2022-03-25 LAB — RAPID URINE DRUG SCREEN, HOSP PERFORMED
Amphetamines: NOT DETECTED
Barbiturates: NOT DETECTED
Benzodiazepines: POSITIVE — AB
Cocaine: NOT DETECTED
Opiates: NOT DETECTED
Tetrahydrocannabinol: NOT DETECTED

## 2022-03-25 LAB — SARS CORONAVIRUS 2 BY RT PCR: SARS Coronavirus 2 by RT PCR: NEGATIVE

## 2022-03-25 MED ORDER — CYCLOBENZAPRINE HCL 10 MG PO TABS
10.0000 mg | ORAL_TABLET | Freq: Once | ORAL | Status: AC
  Administered 2022-03-25: 10 mg via ORAL
  Filled 2022-03-25: qty 1

## 2022-03-25 NOTE — Progress Notes (Signed)
Pt was accept to [facility]   Pt meets inpatient criteria per Lindon Romp, NP  Attending Physician will be Dr. Elaina Hoops  Report can be called to: [phone]  Pt can arrive after 8pm on 03/25/22  Care Team notified: Tora Kindred, RN, and Lindon Romp, NP  Nadara Mode, [place] 03/25/2022 @ 3:13 AM

## 2022-03-25 NOTE — ED Notes (Signed)
Patient discharged off unit to facility per provider. Patient alert and oriented, calm, cooperative, no s/s of distress at this times. Discharge information and belongings given to sheriff for transport. Patient ambulatory off unit, escorted and transported by sheriff.

## 2022-03-25 NOTE — Progress Notes (Signed)
Inpatient Behavioral Health Placement  Pt meets inpatient criteria per Lindon Romp, NP. There are no available beds at [facility] per [facility] Wynonia Hazard, RN. Referral was sent to the following facilities;    Destination Service Provider Address Phone Fax  [facility]  [address] [place] [address] [phone] [facility]  [place], [place] [address] [phone] [facility]  [address] [place] [address] [place]  [facility]  [address] [place] [address] [phone]  [place] [address] [place] [address] [phone]  [facility]  [place] [address] [phone]  [facility]  [address] [place] [address] [phone] [facility]  [address] [place] [address] [phone] [facility]  [address] [place] [address] [place]  [address] [place] [address] [phone] [facility]  [address] [place] [address] [phone] [place], [place] [address] [phone] [place]  [address] [place] [address] [phone] [facility]  [address] [place] [address] [phone]   Situation ongoing,  CSW will follow up.   Benjaman Kindler, MSW, LCSWA 03/25/2022  @ 2:27 AM

## 2022-03-26 ENCOUNTER — Ambulatory Visit: Payer: Medicaid Other | Admitting: Podiatry

## 2022-04-01 ENCOUNTER — Ambulatory Visit (INDEPENDENT_AMBULATORY_CARE_PROVIDER_SITE_OTHER): Payer: Medicaid Other | Admitting: Licensed Clinical Social Worker

## 2022-04-01 DIAGNOSIS — F332 Major depressive disorder, recurrent severe without psychotic features: Secondary | ICD-10-CM

## 2022-04-01 DIAGNOSIS — F0630 Mood disorder due to known physiological condition, unspecified: Secondary | ICD-10-CM | POA: Diagnosis not present

## 2022-04-01 DIAGNOSIS — F411 Generalized anxiety disorder: Secondary | ICD-10-CM
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
## 2022-04-06 ENCOUNTER — Ambulatory Visit: Payer: Medicaid Other | Admitting: Podiatry

## 2022-04-16 ENCOUNTER — Ambulatory Visit (HOSPITAL_COMMUNITY): Payer: Medicaid Other | Admitting: Licensed Clinical Social Worker

## 2022-04-16 DIAGNOSIS — F331 Major depressive disorder, recurrent, moderate: Secondary | ICD-10-CM

## 2022-04-16 DIAGNOSIS — F0630 Mood disorder due to known physiological condition, unspecified: Secondary | ICD-10-CM

## 2022-04-16 DIAGNOSIS — F411 Generalized anxiety disorder: Secondary | ICD-10-CM

## 2022-04-16 NOTE — Progress Notes (Signed)
Patient in the middle of moving forgot appointment wants to cancel for today

## 2022-04-23 ENCOUNTER — Ambulatory Visit (INDEPENDENT_AMBULATORY_CARE_PROVIDER_SITE_OTHER): Payer: Medicaid Other | Admitting: Licensed Clinical Social Worker

## 2022-04-23 DIAGNOSIS — F411 Generalized anxiety disorder: Secondary | ICD-10-CM

## 2022-04-23 DIAGNOSIS — F0630 Mood disorder due to known physiological condition, unspecified: Secondary | ICD-10-CM

## 2022-04-23 DIAGNOSIS — F331 Major depressive disorder, recurrent, moderate: Secondary | ICD-10-CM
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
## 2022-04-24 NOTE — Addendum Note (Signed)
Addended by: Coolidge Breeze A on: 04/24/2022 12:09 PM   Modules accepted: Level of Service

## 2022-04-30 ENCOUNTER — Ambulatory Visit (INDEPENDENT_AMBULATORY_CARE_PROVIDER_SITE_OTHER): Payer: Medicaid Other | Admitting: Licensed Clinical Social Worker

## 2022-04-30 DIAGNOSIS — F0630 Mood disorder due to known physiological condition, unspecified: Secondary | ICD-10-CM | POA: Diagnosis not present

## 2022-04-30 DIAGNOSIS — F332 Major depressive disorder, recurrent severe without psychotic features: Secondary | ICD-10-CM

## 2022-04-30 DIAGNOSIS — F411 Generalized anxiety disorder: Secondary | ICD-10-CM | POA: Diagnosis not present
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
## 2022-05-05 ENCOUNTER — Ambulatory Visit (INDEPENDENT_AMBULATORY_CARE_PROVIDER_SITE_OTHER): Payer: Medicaid Other | Admitting: Licensed Clinical Social Worker

## 2022-05-05 DIAGNOSIS — F0630 Mood disorder due to known physiological condition, unspecified: Secondary | ICD-10-CM

## 2022-05-05 DIAGNOSIS — F411 Generalized anxiety disorder: Secondary | ICD-10-CM

## 2022-05-05 DIAGNOSIS — F332 Major depressive disorder, recurrent severe without psychotic features: Secondary | ICD-10-CM | POA: Diagnosis not present

## 2022-05-05 NOTE — Plan of Care (Signed)
  Problem: Anxiety Disorder CCP Problem  1 decrease Goal:  decrease decrease and work on anger management strategies Outcome: Progressing Goal: LTG: Patient will score less than 5 on the Generalized Anxiety Disorder 7 Scale (GAD-7) Outcome: Progressing Goal: STG: Report a decrease in anxiety symptoms as evidenced by an overall reduction in anxiety score by a minimum of 25% on the Generalized Anxiety Disorder Scale Outcome: Progressing

## 2022-05-05 NOTE — Progress Notes (Signed)
Virtual Visit via Video Note  I connected with Jeanne [name] on 05/05/22 at  1:00 PM EST by a video enabled telemedicine application and verified that I am speaking with the correct person using two identifiers.  Location: Patient: home Provider: office   I discussed the limitations of evaluation and management by telemedicine and the availability of in person appointments. The patient expressed understanding and agreed to proceed.   I discussed the assessment and treatment plan with the patient. The patient was provided an opportunity to ask questions and all were answered. The patient agreed with the plan and demonstrated an understanding of the instructions.   The patient was advised to call back or seek an in-person evaluation if the symptoms worsen or if the condition fails to improve as anticipated.  I provided 45 minutes of non-face-to-face time during this encounter.  THERAPIST PROGRESS NOTE  Session Time: 1:00 PM to 1:45 PM  Participation Level: Active  Behavioral Response: CasualAlertcalm  Type of Therapy: Individual Therapy  Treatment Goals addressed:  Address anxiety, depression, anger, coping  ProgressTowards Goals: Progressing-reviewed treatment plan patient finds therapy helpful particularly with strategies to change her situation which is a significant source of depression, in general patient provided support space for patient to talk about thoughts and feelings that help her cope with stressors encouraged helpful coping strategies around helpful attitudes  Interventions: CBT, Solution Focused, Strength-based, Supportive, and Other: coping  Summary: Jeanne [name] is a 31 y.o. female who presents with doing ok journaling and writing dreams down. For past two days. After reviewing treatment plan patient's feedback was that she feels we talked about a lot doesn't fix her situation but gives her options to take to improve situation which is a big part of depression.  Has her thinking in a more progressive way think of what therapist told her a lot of the time so get there with her goals.  Rescheduled and will go into the hospital for sleep study . There is one scheduled in February on weekend. May be having sleeplessness. Wonders if seizure connected to sleep. Clonidine worked. 2-3 nights ago didn't take meds and didn't sleep for 20 hours.  Therapist explored whether she can make relationship work patient says has explored this and feels irretrievably broken relationship. Feels can't focus on anything needs to be disciplined go with the flow and believe what is true with this relationship. If calm down and not react to the things says and not care nice attitude through the day. Nice during the day nice energy.  Therapist feedback was this would be very helpful easy to say but thinks if she truly believes in heart really feel in heart which has last few days can help. Patient says therapist saying it sounds more legitimate.Talked about books reading. Book of numbers. Why suffering past life must have done something wrong although she does look at her life and realizes not as bad as it could be.  During session her partner came in and therapist met him he expressed concerned about Zoloft and seizures and therapist guided him to work with doctors on this. After patient said  talks in ways in condescending therapist noted another way to look at it is he thinks he knows more than he knows. Patient reflecting and wonders if she is ready to give the relationship up but answers she is.  Very supportive dad not sure as much emotionally. Doesn't have money either. [place] four  bedroom. Patient says first step of all of things get a car.  Therapist assesses helpful to start coming up with a plan to move forward.  Patient says she has mental blocks in her head don't want to live in [place] and shelter think things have to compromise with. With disability  would open options. If consider any work won't stand on foot for medical reasons        Therapist encouraged patient with attitudes that can help her cope including being nice manifesting positive energy get help the situation as well as letting things go, describing and not care attitude.  Therapist noted this is wisdom as things evolve in life we learn not to care about so many things only care about specific things and direct our energy toward those.  Noted its a skill to work on.  Noted if she cannot get to a place of not caring to walk away give herself time to calm down and her prefrontal cortex will take over and help her in having better choices then when she is in emotional brain.  In general therapist gave patient support and space to talk about thoughts and feelings to help with coping.  Noted positive patient is taking her medication help with sleeping therapist noted lack of sleep can be significant in terms of mental health.  Reviewed treatment plan patient gave consent to complete virtually patient noted helpfulness of therapy has been learning about options that can help improve her situation but taking it slow and she has to make some choices about what is best for her.  Positive as well patient rescheduled sleep study as patient tries to improve situation would be helpful if medical issues improved at the same time helpful of other resources came in to place so she has more options.  Therapist noted in session patient calm as a positive side also that she is journaling and would be helpful for her to continue.  Therapist provided some basic CBT that what patient thinks can impact how she feels so ways to think more hopefully as she was doing earlier in session as helpful.  Provided resource of book for letting go Suicidal/Homicidal: No  Plan: Return again in 1 week.2.  Therapist work with patient on helpful coping that can include self talk, helpful attitudes, problem solving  Diagnosis:  Mood disorder in conditions classified elsewhere, generalized anxiety disorder, major depressive disorder, recurrent, severe  Collaboration of Care: Other none needed  Patient/Guardian was advised Release of Information must be obtained prior to any record release in order to collaborate their care with an outside provider. Patient/Guardian was advised if they have not already done so to contact the registration department to sign all necessary forms in order for [place] to release information regarding their care.   Consent: Patient/Guardian gives verbal consent for treatment and assignment of benefits for services provided during this visit. Patient/Guardian expressed understanding and agreed to proceed.   Cordella Register, LCSW 05/05/2022

## 2022-05-12 ENCOUNTER — Encounter (HOSPITAL_COMMUNITY): Payer: Self-pay

## 2022-05-12 ENCOUNTER — Ambulatory Visit (HOSPITAL_COMMUNITY): Payer: Medicaid Other | Admitting: Licensed Clinical Social Worker

## 2022-05-12 NOTE — Progress Notes (Signed)
Therapist texted patient for session and she did not respond

## 2022-05-21 ENCOUNTER — Ambulatory Visit (INDEPENDENT_AMBULATORY_CARE_PROVIDER_SITE_OTHER): Payer: Medicaid Other | Admitting: Licensed Clinical Social Worker

## 2022-05-21 DIAGNOSIS — F332 Major depressive disorder, recurrent severe without psychotic features: Secondary | ICD-10-CM

## 2022-05-21 DIAGNOSIS — F411 Generalized anxiety disorder: Secondary | ICD-10-CM | POA: Diagnosis not present

## 2022-05-21 DIAGNOSIS — F0630 Mood disorder due to known physiological condition, unspecified: Secondary | ICD-10-CM
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
## 2022-05-28 ENCOUNTER — Encounter (HOSPITAL_COMMUNITY): Payer: Self-pay

## 2022-05-28 ENCOUNTER — Ambulatory Visit (HOSPITAL_COMMUNITY): Payer: Medicaid Other | Admitting: Licensed Clinical Social Worker

## 2022-05-28 NOTE — Progress Notes (Signed)
Therapist contacted patient for session by text and she did not respond 

## 2022-06-02 ENCOUNTER — Encounter (HOSPITAL_COMMUNITY): Payer: Self-pay

## 2022-06-02 ENCOUNTER — Ambulatory Visit (HOSPITAL_COMMUNITY): Payer: Medicaid Other | Admitting: Licensed Clinical Social Worker

## 2022-06-02 DIAGNOSIS — F411 Generalized anxiety disorder: Secondary | ICD-10-CM

## 2022-06-02 DIAGNOSIS — F0630 Mood disorder due to known physiological condition, unspecified: Secondary | ICD-10-CM

## 2022-06-02 DIAGNOSIS — F331 Major depressive disorder, recurrent, moderate: Secondary | ICD-10-CM

## 2022-06-02 NOTE — Progress Notes (Signed)
Therapist contacted patient through text and she did not respond. Session is a no show

## 2022-06-03 ENCOUNTER — Encounter (HOSPITAL_COMMUNITY): Payer: Self-pay | Admitting: Licensed Clinical Social Worker

## 2022-06-10 ENCOUNTER — Ambulatory Visit (HOSPITAL_COMMUNITY): Payer: Medicaid Other | Admitting: Licensed Clinical Social Worker

## 2022-06-18 ENCOUNTER — Ambulatory Visit (HOSPITAL_COMMUNITY): Payer: Medicaid Other | Admitting: Licensed Clinical Social Worker

## 2022-06-23 ENCOUNTER — Ambulatory Visit (HOSPITAL_COMMUNITY): Payer: Medicaid Other | Admitting: Licensed Clinical Social Worker

## 2022-07-02 ENCOUNTER — Ambulatory Visit (HOSPITAL_COMMUNITY): Payer: Medicaid Other | Admitting: Licensed Clinical Social Worker

## 2022-07-07 ENCOUNTER — Ambulatory Visit (HOSPITAL_COMMUNITY): Payer: Medicaid Other | Admitting: Licensed Clinical Social Worker

## 2022-07-16 ENCOUNTER — Ambulatory Visit (INDEPENDENT_AMBULATORY_CARE_PROVIDER_SITE_OTHER): Payer: Medicaid Other | Admitting: Licensed Clinical Social Worker

## 2022-07-16 DIAGNOSIS — F411 Generalized anxiety disorder: Secondary | ICD-10-CM

## 2022-07-16 DIAGNOSIS — F0630 Mood disorder due to known physiological condition, unspecified: Secondary | ICD-10-CM | POA: Diagnosis not present

## 2022-07-16 DIAGNOSIS — F331 Major depressive disorder, recurrent, moderate: Secondary | ICD-10-CM

## 2022-07-16 NOTE — Progress Notes (Signed)
Virtual Visit via Video Note  I connected with Jeanne [name] on 07/16/22 at  3:00 PM EST by a video enabled telemedicine application and verified that I am speaking with the correct person using two identifiers.  Location: Patient: home Provider: home office   I discussed the limitations of evaluation and management by telemedicine and the availability of in person appointments. The patient expressed understanding and agreed to proceed.   I discussed the assessment and treatment plan with the patient. The patient was provided an opportunity to ask questions and all were answered. The patient agreed with the plan and demonstrated an understanding of the instructions.   The patient was advised to call back or seek an in-person evaluation if the symptoms worsen or if the condition fails to improve as anticipated.  I provided 40 minutes of non-face-to-face time during this encounter.  THERAPIST PROGRESS NOTE  Session Time: 3:00 PM to 3:40 PM  Participation Level: Active  Behavioral Response: CasualAlertDepressed  Type of Therapy: Individual Therapy  Treatment Goals addressed:   Address anxiety, depression, anger, coping  ProgressTowards Goals: Progressing-therapist provided base and support for patient to talk about thoughts and feelings to help with coping worked on management of depression and will encourage patient to become more active get outside as a step to help her with this  Interventions: Solution Focused, Strength-based, Supportive, and Other: coping  Summary: Jeanne [name] is a 32 y.o. female who presents with feeling getting a little bit better was going to show tail today but didn't explains she has a lot of things to look forward. Therapist explored this with her had an MRI for epilepsy.  Her hope is that they find something so that she does not have a seizure issue want to be there for her babies. Would like to not take medicine and be like was before when she was doing  good. Feels have things to look forward so won't "show out". Trying to not rely on medicines make her tired although still taking them. Within the last day said tried to not to rely on them why almost got upset have to chill out. On 9th sleep study.  Therapist very excited about this as this was on the works before and got canceled. He wants to go with her hope family there so kids can be watched if not he has to watch the kids he said to schedule on weekend. He is controlling why he wants to be there. Normal day she watches the kids in the house. He is in house most of the time he throws things on floor even if clean even if try to clean he doesn't care he pays bills he says. Talked to psychiatrist he is the main stressor leave the meds as it is. This long-term situation have to deal with in her life. Long-term plan need help. How to remove stressor? 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 Went to doctor said depression. Needed help with help he didn't help any with house responsibilities. She had two stay at home girls stressing day of seizure didn't have time to herself so can study. [place] behind. Don't think a good time to go to school lord showing things right now how to improve. Came home and thought focus on house and then house  looked disgusting. Thinks focus on get things straight in her mind. Needs to stop drinking [facility] needs to drink more water feels a big difference. Get out feel so much better but can't. Have a 10, 5-teach her at home 3-focus right now focus on being a parent but plan when they older do more things for herself. 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 Like to get started with that it was dream. Right now too depressed not happy like used to. Wake up patient stay in room  keep kids out. Force herself to teach them things. Sometimes force clean up. Trying to do more but hit a wall irritates her. Love to do more when he is out of the house can do more. Him being there all times overbearing. Can do it in mind.  Therapist started plan working on depression also getting out of the house to take walks patient agrees to do 30-minute walk twice a week, at least twice a week.  Discussed letter for patient and therapist willing to write letter as therapist assesses patient has been unstable volatile think she needs time to focus on treatment.  Noted some improvement patient says feels a little better and explain why she had her MRI, going to get her sleep study, has plans for the future.  Main stressor continues to be her partner and ongoing therapy is coping with this as well as problem solving to help remove the stressor.  Therapist noted carving out some space for her to find some happiness being alone and in joining that time but patient is depressed.  Still encouraging her with more activity encouraged her to get outside as it is oppressive in the house as well as improves mood to be doing something getting outside and exercise.  Will focus more on depressive strategies that we will help her start to engage in activities that enhance mood.  Therapist provided space and support for patient to talk about thoughts and feelings in session.     Suicidal/Homicidal: No  Plan: Return again in 3 weeks.2.  Work with patient on stressors work patient on thoughts and feelings book for emotional regulation  Diagnosis: Mood disorder in conditions classified elsewhere, generalized anxiety disorder, major depressive disorder, recurrent, severe  Collaboration of Care: Other none needed  Patient/Guardian was advised Release of Information must be obtained prior to any record release in order to collaborate their care with an outside provider. Patient/Guardian was advised if they have not  already done so to contact the registration department to sign all necessary forms in order for [place] to release information regarding their care.   Consent: Patient/Guardian gives verbal consent for treatment and assignment of benefits for services provided during this visit. Patient/Guardian expressed understanding and agreed to proceed.   Cordella Register, LCSW 07/16/2022

## 2022-08-03 ENCOUNTER — Ambulatory Visit (HOSPITAL_COMMUNITY): Payer: Medicaid Other | Admitting: Licensed Clinical Social Worker

## 2022-08-26 ENCOUNTER — Ambulatory Visit (INDEPENDENT_AMBULATORY_CARE_PROVIDER_SITE_OTHER): Payer: Medicaid Other | Admitting: Licensed Clinical Social Worker

## 2022-08-26 DIAGNOSIS — F0630 Mood disorder due to known physiological condition, unspecified: Secondary | ICD-10-CM | POA: Diagnosis not present

## 2022-08-26 DIAGNOSIS — F411 Generalized anxiety disorder: Secondary | ICD-10-CM | POA: Diagnosis not present

## 2022-08-26 DIAGNOSIS — F332 Major depressive disorder, recurrent severe without psychotic features: Secondary | ICD-10-CM

## 2022-08-26 DIAGNOSIS — F331 Major depressive disorder, recurrent, moderate: Secondary | ICD-10-CM

## 2022-08-26 NOTE — Progress Notes (Signed)
Virtual Visit via Telephone Note  I connected with Jeanne [name] on 08/26/22 at  3:00 PM EDT by telephone and verified that I am speaking with the correct person using two identifiers.  Location: Patient: home Provider: home office   I discussed the limitations, risks, security and privacy concerns of performing an evaluation and management service by telephone and the availability of in person appointments. I also discussed with the patient that there may be a patient responsible charge related to this service. The patient expressed understanding and agreed to proceed.   I discussed the assessment and treatment plan with the patient. The patient was provided an opportunity to ask questions and all were answered. The patient agreed with the plan and demonstrated an understanding of the instructions.   The patient was advised to call back or seek an in-person evaluation if the symptoms worsen or if the condition fails to improve as anticipated.  I provided 51 minutes of non-face-to-face time during this encounter.  THERAPIST PROGRESS NOTE  Session Time: 3:00 PM to 3:51 PM  Participation Level: Active  Behavioral Response: CasualAlertsappropriate  Type of Therapy: Individual Therapy  Treatment Goals addressed: Address anxiety, depression, anger, coping  ProgressTowards Goals: Progressing-patient is implementing in process and continuing with practices as well as adding ones that we will help mental health including meditation, exercising, reading literature, journaling  Interventions: Solution Focused, Strength-based, Supportive, and Other: Coping  Summary: Jeanne [name] is a 32 y.o. female who presents with things a little bit better last couple of weeks pushing herself. Went to ER not in a good space. At home gets tense.  Therapist noted positive when to go to ER good coping if she needs to.  Explored what she is better? She feels has some goal like mentally and physically. Bought  herself shoes wants to go working out. In her mind got to go, got to go work out. Feel has to clean house before go feels push herself to clean she has been cleaning the last couple of days.  Only one who does gets dirty quickly. Noticed the positive and motivation that she has done this. Wants to walk around the neighbor with dog. Goal was to work out and walk long time for shoes to get there. Bought insoles feel better on feet confident be able to walk. Wants to work on being In tune to herself and that will help. Feels everything need to journal do at the moment but feels need to do more journaling. In head space reads but with epilepsy makes her forget and feels has to reread chapters but that is ok. Shared about the book she was reading oracle from a [place] was saying know thyself so patient says has to take a  deep dive with herself do  shadow work go within herself and write down the things about her that have ignored and what really want. Her issues. Book is called "Psychic witch a metaphysical guide to meditation, magic manifestation."  Explored optimism cleaning and said hello to bug. See flower blooming pick flowers and playing with girls flower as toy wiht [employer] meal.  Sleep still trying to get in routine with med increase have to take early in evening to get bed at 10 PM or 11 PM Clonodine  increased doesn't think help. Doesn't have have sleep apnea take another EEG not having seizures on meds. Walk challenge 2 days a week.  Strategy for walking sunglasses, hat with a wig and dog  with a leash.  Shadow shared in her book from dark side spectrum dark to lighter.  Meditation helps does it but not enough time but when doesn't do it feels lazy procrastinate doesn't understand that about herself. Explored how she medicates she tunes in bring in celestial energy down don't do it because it will be ruined interrupted. Says has to accept that is her children.  Noticed acceptance is helpful  but other ways she can meditate.   Noted positive patient has taken some steps to begin walking has the shoes therapist made to challenge both of [place] will walk at least twice a week.  Noted the benefits of exercise for mental health and enhances dopamine, helps positive focus, self-esteem helps with health noticed as well helpful to be outside helps with mood.  Reviewed article 9 things to help with happiness including things like giving gifts, talking to strangers journaling, meditation, practicing gratitude and optimism talked about what gratitude and optimism means noticing the positive savoring things looking at life in positive ways.  Noted just like physical health mental health has to be practice some more immediate things to do some get better as you do more such as exercise sleep.  Noted positive patient makes efforts to practice meditation, plans to journal wants to work on shadow work.  Spent some time talking about shadow work is bringing unconscious to conscious that helps [place] to be able to corrected helps Every person has a shadow but the idea is that we can acknowledge it excepted and integrated back into awareness is something that rightly belongs to [place] we enjoy greater mental health authenticity creativity energy maturity.  Explored for example patient's feeling that she always should be doing something if not lazy therapist noted may feel that she always has to keep busy as being constructive but that actually the downtime is important for [place] as well noticed this could be shadow work noticed that words should that makes [place] feel bad putting to much demand on her cells and we need to be easier on herself.  Looked at things patient is looking at and how they can be helpful such as current book also introduced mindfulness noted different ways she can do meditation that she is walking meditation.  Noted mindfulness is helpful for emotional regulation something to work on with patient and helps her not  be caught up and feelings but helps her be better observer of her feeling so better way to intervene send patient worksheet on mindfulness Suicidal/Homicidal: No  Plan: Return again in 3 weeks.2.  Patient look at mindfulness handout therapist show patient more information on mindfulness look at thoughts and emotions handout  Diagnosis: Mood disorder in conditions classified elsewhere, generalized anxiety disorder, major depressive disorder, recurrent, severe  Collaboration of Care: Other none needed  Patient/Guardian was advised Release of Information must be obtained prior to any record release in order to collaborate their care with an outside provider. Patient/Guardian was advised if they have not already done so to contact the registration department to sign all necessary forms in order for [place] to release information regarding their care.   Consent: Patient/Guardian gives verbal consent for treatment and assignment of benefits for services provided during this visit. Patient/Guardian expressed understanding and agreed to proceed.   Cordella Register, LCSW 08/26/2022

## 2022-09-17 ENCOUNTER — Encounter (HOSPITAL_COMMUNITY): Payer: Self-pay

## 2022-09-17 ENCOUNTER — Encounter (HOSPITAL_COMMUNITY): Payer: Self-pay | Admitting: Licensed Clinical Social Worker

## 2022-09-17 ENCOUNTER — Ambulatory Visit (INDEPENDENT_AMBULATORY_CARE_PROVIDER_SITE_OTHER): Payer: Medicaid Other | Admitting: Licensed Clinical Social Worker

## 2022-09-17 DIAGNOSIS — F0630 Mood disorder due to known physiological condition, unspecified: Secondary | ICD-10-CM

## 2022-09-17 DIAGNOSIS — F331 Major depressive disorder, recurrent, moderate: Secondary | ICD-10-CM

## 2022-09-17 DIAGNOSIS — F411 Generalized anxiety disorder: Secondary | ICD-10-CM

## 2022-09-17 NOTE — Progress Notes (Signed)
Therapist contacted patient through text and she did not respond 

## 2023-08-20 IMAGING — MR MR ANKLE*L* W/O CM
5 series · 40 of 40 positions shown · non-contrast
Comparison: X-ray foot 04/22/2021.

CLINICAL DATA: Congenital malformation, limb(s). Left ankle pain
associated with accessory navicular bone of left foot. Posterior
tibial tendonitis.

EXAM:
MRI OF THE LEFT ANKLE WITHOUT CONTRAST
TECHNIQUE: Multiplanar, multisequence MR imaging of the ankle was performed. No
intravenous contrast was administered.

[Series 4: T2 fat-sat · axial · 3.0mm · 0.53mm/px · z∈[-117,+22]mm · 9 of 36 slices shown (1 of 2)]
[im 1/36]
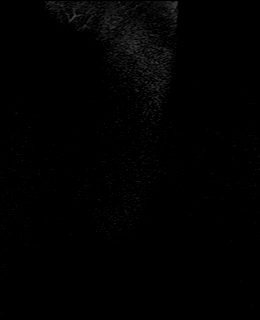
[im 5/36]
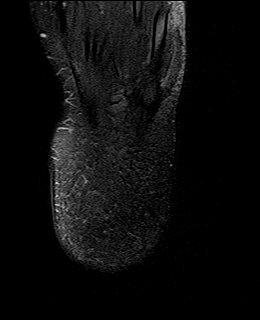
[im 9/36]
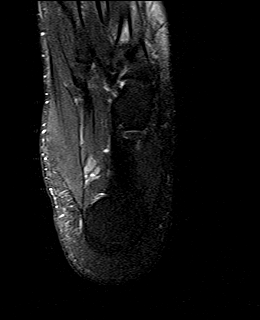
[im 14/36]
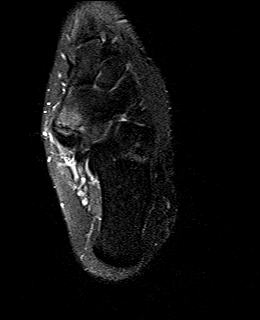
[im 18/36]
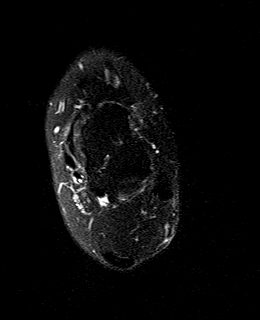
[im 22/36]
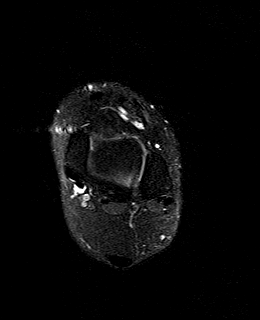
[im 27/36]
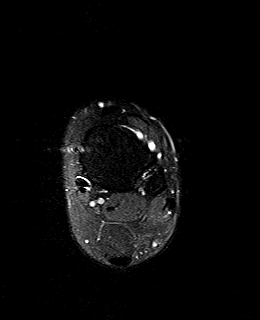
[im 31/36]
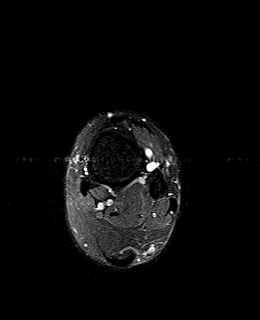
[im 36/36]
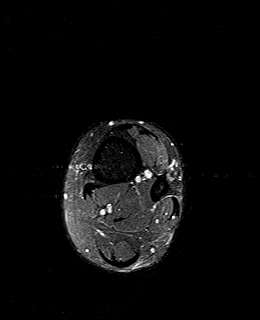

[Series 5: PD fat-sat · axial · 3.0mm · 0.56mm/px · z∈[-117,+22]mm · 10 of 36 slices shown]
[im 1/36]
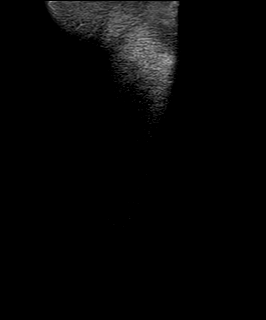
[im 4/36]
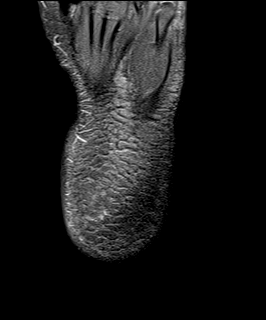
[im 8/36]
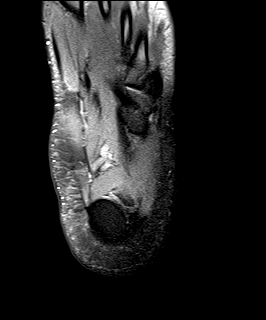
[im 12/36]
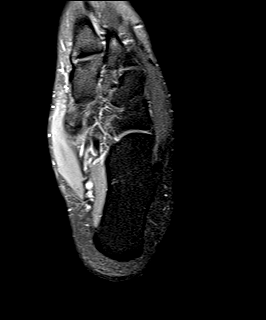
[im 16/36]
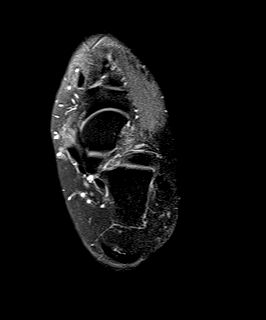
[im 20/36]
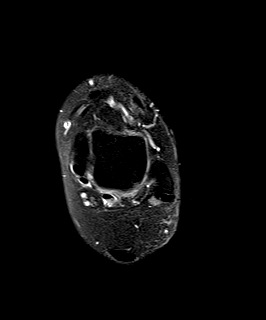
[im 24/36]
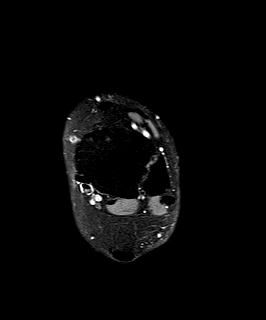
[im 28/36]
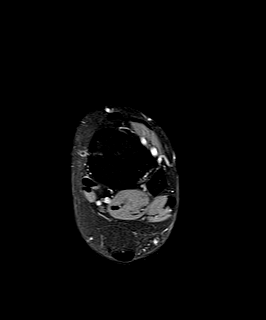
[im 32/36]
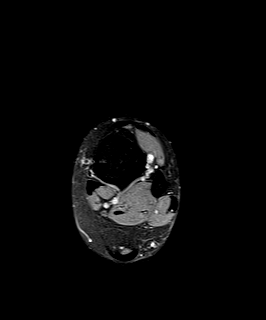
[im 36/36]
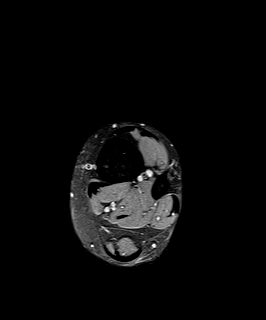

[Series 6: T1 · sagittal · 4.0mm · 0.56mm/px · 5 of 18 slices shown]
[im 1/18]
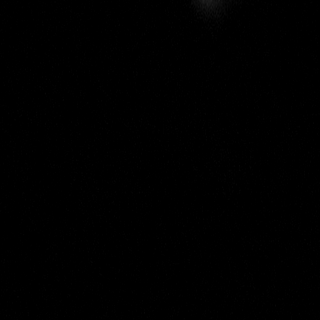
[im 5/18]
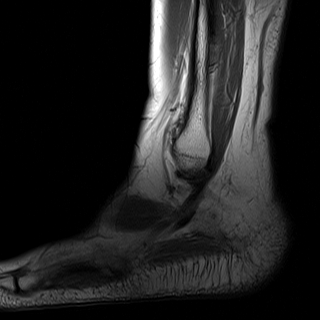
[im 9/18]
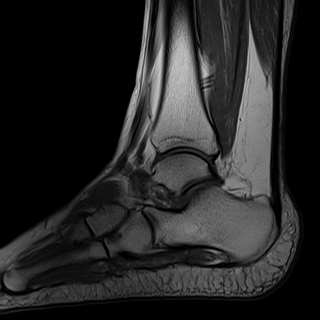
[im 13/18]
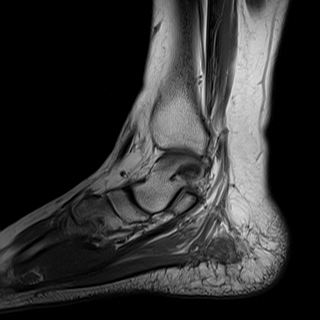
[im 18/18]
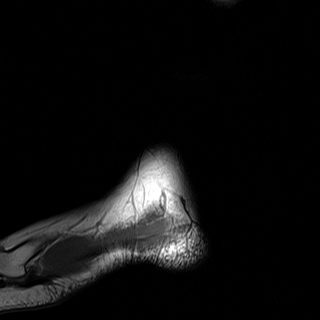

[Series 7: STIR · sagittal · 4.0mm · 0.70mm/px · 5 of 18 slices shown]
[im 1/18]
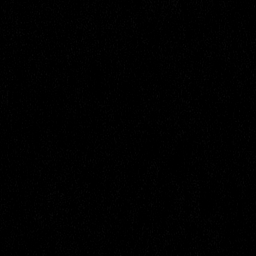
[im 5/18]
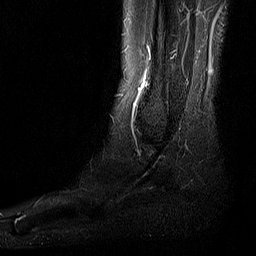
[im 9/18]
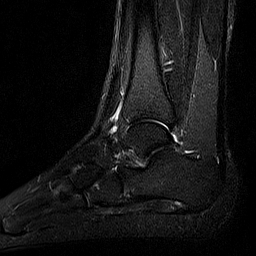
[im 13/18]
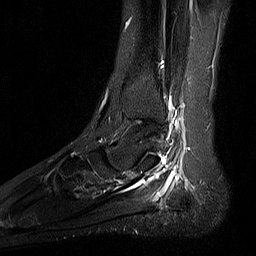
[im 18/18]
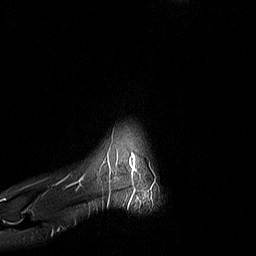

[Series 8: T2 fat-sat · coronal · 3.0mm · 0.62mm/px · 11 of 42 slices shown (2 of 2)]
[im 1/42]
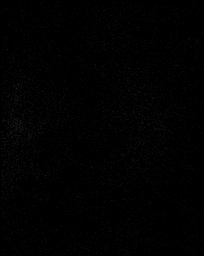
[im 5/42]
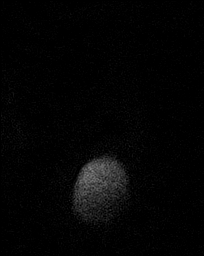
[im 9/42]
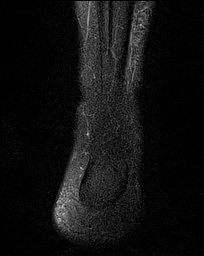
[im 13/42]
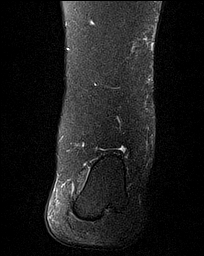
[im 17/42]
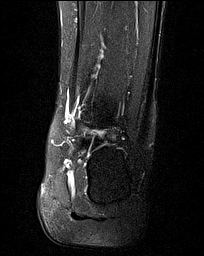
[im 21/42]
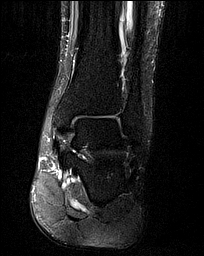
[im 25/42]
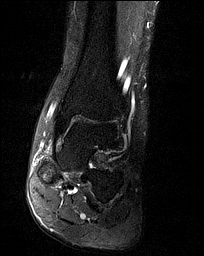
[im 29/42]
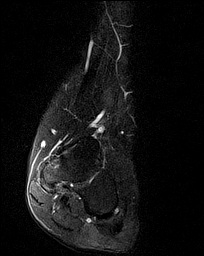
[im 33/42]
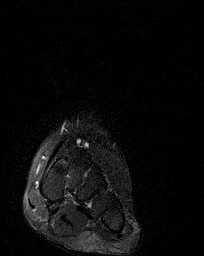
[im 37/42]
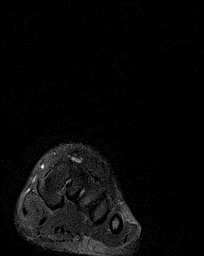
[im 42/42]
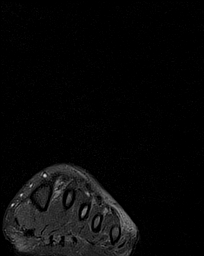

[40 of 40 positions shown; findings below may reference images not displayed]

FINDINGS: TENDONS

Peroneal: Peroneus longus and brevis tendons are intact and normally
positioned.

Posteromedial: Tibialis posterior, flexor hallucis longus, and
flexor digitorum longus tendons are intact and normally positioned.
Minimal tendinosis of the distal tibialis posterior tendon. Trace
tenosynovial fluid associated with each of the posteromedial ankle
tendon sheaths.

Anterior: Tibialis anterior, extensor hallucis longus, and extensor
digitorum longus tendons are intact and normally positioned.

Achilles: Intact.

Plantar Fascia: Intact.

LIGAMENTS

Lateral: Intact tibiofibular ligaments. The anterior and posterior
talofibular ligaments are intact. Intact calcaneofibular ligament.

Medial: The deltoid and visualized portions of the spring ligament
appear intact.

CARTILAGE AND BONES

Ankle Joint: No significant ankle joint effusion. The talar dome and
tibial plafond are intact.

Subtalar Joints/Sinus Tarsi: No cartilage defect. No effusion.
Preservation of the anatomic fat within the sinus tarsi.

Bones: Type 2 accessory navicular with bone marrow edema in the
ossicle and within the medial aspect of the cuneiform. No fluid
signal is seen within the synchondrosis. There is bone marrow edema
within the third metatarsal shaft with possible nondisplaced
incomplete fracture line along the proximal metaphysis (series 7,
image 9). Remaining visualized osseous structures are otherwise
intact and unremarkable. No malalignment. No suspicious bone lesion.

Other: No significant soft tissue findings.
IMPRESSION: 1. Bone marrow edema within the third metatarsal shaft with possible
nondisplaced incomplete fracture line along the proximal metaphysis.
Findings may represent a stress fracture.
2. Type 2 accessory navicular with bone marrow edema in the ossicle
and within the medial aspect of the cuneiform. Findings can be seen
in the setting of painful os navicular syndrome.
3. Minimal tendinosis of the distal tibialis posterior tendon. Trace
tenosynovial fluid associated with each of the posteromedial ankle
tendon sheaths.
